# Patient Record
Sex: Female | Born: 1975 | Race: White | Hispanic: No | Marital: Married | Smoking: Never smoker
Health system: Southern US, Community
[De-identification: ages and names within clinical notes are randomized; demographics above are authoritative.]

## PROBLEM LIST (undated history)

## (undated) DIAGNOSIS — E079 Disorder of thyroid, unspecified: Secondary | ICD-10-CM

## (undated) DIAGNOSIS — G90A Postural orthostatic tachycardia syndrome (POTS): Secondary | ICD-10-CM

## (undated) DIAGNOSIS — R5382 Chronic fatigue, unspecified: Secondary | ICD-10-CM

## (undated) DIAGNOSIS — I639 Cerebral infarction, unspecified: Secondary | ICD-10-CM

## (undated) DIAGNOSIS — T7840XA Allergy, unspecified, initial encounter: Secondary | ICD-10-CM

## (undated) DIAGNOSIS — R112 Nausea with vomiting, unspecified: Secondary | ICD-10-CM

## (undated) DIAGNOSIS — D219 Benign neoplasm of connective and other soft tissue, unspecified: Secondary | ICD-10-CM

## (undated) DIAGNOSIS — M549 Dorsalgia, unspecified: Secondary | ICD-10-CM

## (undated) DIAGNOSIS — Z862 Personal history of diseases of the blood and blood-forming organs and certain disorders involving the immune mechanism: Secondary | ICD-10-CM

## (undated) DIAGNOSIS — I951 Orthostatic hypotension: Secondary | ICD-10-CM

## (undated) DIAGNOSIS — R51 Headache: Secondary | ICD-10-CM

## (undated) DIAGNOSIS — J45909 Unspecified asthma, uncomplicated: Secondary | ICD-10-CM

## (undated) DIAGNOSIS — G47419 Narcolepsy without cataplexy: Secondary | ICD-10-CM

## (undated) DIAGNOSIS — Z9889 Other specified postprocedural states: Secondary | ICD-10-CM

## (undated) DIAGNOSIS — I498 Other specified cardiac arrhythmias: Secondary | ICD-10-CM

## (undated) DIAGNOSIS — R000 Tachycardia, unspecified: Secondary | ICD-10-CM

## (undated) HISTORY — DX: Cerebral infarction, unspecified: I63.9

## (undated) HISTORY — PX: TONSILLECTOMY AND ADENOIDECTOMY: SHX28

## (undated) HISTORY — PX: OTHER SURGICAL HISTORY: SHX169

## (undated) HISTORY — DX: Allergy, unspecified, initial encounter: T78.40XA

## (undated) HISTORY — PX: ABDOMINAL HYSTERECTOMY: SHX81

## (undated) HISTORY — PX: TONSILLECTOMY: SUR1361

---
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
## 1998-09-21 HISTORY — PX: NASAL SINUS SURGERY: SHX719

## 2006-04-28 ENCOUNTER — Emergency Department (HOSPITAL_COMMUNITY): Admission: EM | Admit: 2006-04-28 | Discharge: 2006-04-28 | Payer: Self-pay | Admitting: Emergency Medicine

## 2006-06-12 ENCOUNTER — Encounter: Admission: RE | Admit: 2006-06-12 | Discharge: 2006-06-12 | Payer: Self-pay | Admitting: Neurology

## 2006-07-01 ENCOUNTER — Emergency Department (HOSPITAL_COMMUNITY): Admission: EM | Admit: 2006-07-01 | Discharge: 2006-07-01 | Payer: Self-pay | Admitting: Emergency Medicine

## 2006-07-07 ENCOUNTER — Ambulatory Visit: Payer: Self-pay | Admitting: Internal Medicine

## 2006-09-28 ENCOUNTER — Ambulatory Visit: Payer: Self-pay | Admitting: Internal Medicine

## 2006-10-18 ENCOUNTER — Ambulatory Visit: Payer: Self-pay | Admitting: Internal Medicine

## 2006-10-18 LAB — CONVERTED CEMR LAB
BUN: 19 mg/dL (ref 6–23)
Calcium: 8.7 mg/dL (ref 8.4–10.5)
Chloride: 111 meq/L (ref 96–112)
GFR calc Af Amer: 94 mL/min
GFR calc non Af Amer: 78 mL/min

## 2006-12-31 ENCOUNTER — Ambulatory Visit: Payer: Self-pay | Admitting: Internal Medicine

## 2007-01-17 ENCOUNTER — Ambulatory Visit: Payer: Self-pay | Admitting: Internal Medicine

## 2007-01-17 LAB — CONVERTED CEMR LAB
Calcium: 8.8 mg/dL (ref 8.4–10.5)
Chloride: 114 meq/L — ABNORMAL HIGH (ref 96–112)
Creatinine, Ser: 0.8 mg/dL (ref 0.4–1.2)
GFR calc non Af Amer: 89 mL/min

## 2007-07-04 ENCOUNTER — Ambulatory Visit: Payer: Self-pay | Admitting: Internal Medicine

## 2007-07-04 LAB — CONVERTED CEMR LAB
Basophils Relative: 0.4 % (ref 0.0–1.0)
CO2: 28 meq/L (ref 19–32)
Calcium: 8.6 mg/dL (ref 8.4–10.5)
Eosinophils Relative: 0.6 % (ref 0.0–5.0)
GFR calc Af Amer: 150 mL/min
Glucose, Bld: 94 mg/dL (ref 70–99)
HCT: 39.5 % (ref 36.0–46.0)
Hemoglobin: 13.7 g/dL (ref 12.0–15.0)
Lymphocytes Relative: 21.7 % (ref 12.0–46.0)
MCV: 93.8 fL (ref 78.0–100.0)
Monocytes Absolute: 0.5 10*3/uL (ref 0.2–0.7)
Neutro Abs: 5.2 10*3/uL (ref 1.4–7.7)
Neutrophils Relative %: 71 % (ref 43.0–77.0)
Sodium: 145 meq/L (ref 135–145)
WBC: 7.3 10*3/uL (ref 4.5–10.5)

## 2007-12-21 ENCOUNTER — Ambulatory Visit: Payer: Self-pay | Admitting: Internal Medicine

## 2007-12-22 ENCOUNTER — Ambulatory Visit: Payer: Self-pay | Admitting: Internal Medicine

## 2007-12-22 LAB — CONVERTED CEMR LAB
BUN: 12 mg/dL (ref 6–23)
Calcium: 8.7 mg/dL (ref 8.4–10.5)
Chloride: 111 meq/L (ref 96–112)
Creatinine, Ser: 0.7 mg/dL (ref 0.4–1.2)
GFR calc non Af Amer: 103 mL/min

## 2008-04-01 ENCOUNTER — Ambulatory Visit: Payer: Self-pay | Admitting: Cardiology

## 2008-04-01 ENCOUNTER — Inpatient Hospital Stay (HOSPITAL_COMMUNITY): Admission: EM | Admit: 2008-04-01 | Discharge: 2008-04-03 | Payer: Self-pay | Admitting: Emergency Medicine

## 2008-04-18 ENCOUNTER — Ambulatory Visit: Payer: Self-pay | Admitting: Internal Medicine

## 2008-04-18 LAB — CONVERTED CEMR LAB
CO2: 26 meq/L (ref 19–32)
Calcium: 9.1 mg/dL (ref 8.4–10.5)
Creatinine, Ser: 0.8 mg/dL (ref 0.4–1.2)
Glucose, Bld: 88 mg/dL (ref 70–99)

## 2008-06-25 ENCOUNTER — Ambulatory Visit: Payer: Self-pay | Admitting: Internal Medicine

## 2008-06-25 LAB — CONVERTED CEMR LAB
Basophils Absolute: 0 10*3/uL (ref 0.0–0.1)
Eosinophils Absolute: 0.1 10*3/uL (ref 0.0–0.7)
MCHC: 35.1 g/dL (ref 30.0–36.0)
MCV: 92.9 fL (ref 78.0–100.0)
Neutrophils Relative %: 55.1 % (ref 43.0–77.0)
Platelets: 177 10*3/uL (ref 150–400)
WBC: 5.8 10*3/uL (ref 4.5–10.5)

## 2009-01-07 DIAGNOSIS — Q078 Other specified congenital malformations of nervous system: Secondary | ICD-10-CM | POA: Insufficient documentation

## 2009-01-07 DIAGNOSIS — Z91013 Allergy to seafood: Secondary | ICD-10-CM | POA: Insufficient documentation

## 2009-01-07 DIAGNOSIS — I10 Essential (primary) hypertension: Secondary | ICD-10-CM | POA: Insufficient documentation

## 2009-01-07 DIAGNOSIS — G479 Sleep disorder, unspecified: Secondary | ICD-10-CM | POA: Insufficient documentation

## 2009-01-17 ENCOUNTER — Ambulatory Visit: Payer: Self-pay | Admitting: Internal Medicine

## 2009-01-17 DIAGNOSIS — R238 Other skin changes: Secondary | ICD-10-CM | POA: Insufficient documentation

## 2009-02-19 ENCOUNTER — Telehealth: Payer: Self-pay | Admitting: Internal Medicine

## 2009-05-23 ENCOUNTER — Encounter: Payer: Self-pay | Admitting: Internal Medicine

## 2009-07-15 ENCOUNTER — Ambulatory Visit: Payer: Self-pay | Admitting: Internal Medicine

## 2009-09-17 ENCOUNTER — Other Ambulatory Visit: Admission: RE | Admit: 2009-09-17 | Discharge: 2009-09-17 | Payer: Self-pay | Admitting: Family Medicine

## 2010-03-06 ENCOUNTER — Telehealth: Payer: Self-pay | Admitting: Internal Medicine

## 2010-03-07 ENCOUNTER — Emergency Department (HOSPITAL_COMMUNITY): Admission: EM | Admit: 2010-03-07 | Discharge: 2010-03-07 | Payer: Self-pay | Admitting: Emergency Medicine

## 2010-03-10 ENCOUNTER — Ambulatory Visit: Payer: Self-pay | Admitting: Internal Medicine

## 2010-04-02 ENCOUNTER — Telehealth: Payer: Self-pay | Admitting: Internal Medicine

## 2010-04-08 ENCOUNTER — Encounter: Payer: Self-pay | Admitting: Internal Medicine

## 2010-04-10 ENCOUNTER — Telehealth: Payer: Self-pay | Admitting: Internal Medicine

## 2010-04-14 ENCOUNTER — Telehealth: Payer: Self-pay | Admitting: Internal Medicine

## 2010-04-14 ENCOUNTER — Encounter: Payer: Self-pay | Admitting: Internal Medicine

## 2010-06-23 ENCOUNTER — Ambulatory Visit: Payer: Self-pay | Admitting: Internal Medicine

## 2010-09-26 ENCOUNTER — Other Ambulatory Visit
Admission: RE | Admit: 2010-09-26 | Discharge: 2010-09-26 | Payer: Self-pay | Source: Home / Self Care | Admitting: Family Medicine

## 2010-10-21 NOTE — Progress Notes (Signed)
Summary: Need note for work  Phone Note Call from Patient Call back at [employer] [phone]   Caller: Patient Summary of Call: Pt need note for work stating her heart problem.Working giving pt hard time    [employer] with Ms Pascal Lux.      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   Now need another note stating how we define fluctuating hours and that standing in front of over adversely affects her.  Says at this point they are threatening her job and what we put in note may directly affect if they keep her or not.   She will be at work on the 15th but can answer her phone after  3:30 pm  if you need to speak with her.   Initial call taken by: Judie Grieve,  April 02, 2010 1:12 PM  Follow-up for Phone Call        Spoke with Ms Pascal Lux.   She says her employer doesnt understand the note with gave her.  Says they dont understand what not having fluctuating hours mean.  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  Says they are now threatening her job , if our not e doesnt state what they are looking for, but wouldnt provide that to her.  Shes off today but has to  work the 15th..  If you call then call after  3:30hrs   Pt returning call about note for work call back [phone] Judie Grieve  April 07, 2010 3:02 PM Follow-up by: Letta Moynahan, EMT,  April 03, 2010 11:39 AM

## 2010-10-21 NOTE — Assessment & Plan Note (Signed)
Summary: rov.amber   Visit Type:  ROV Primary Provider:  Mila Palmer, MD  CC:  shortness of breath, CP-heart races, and dizziness.  History of Present Illness: [name] is seen in followup for her dysautonomia and POTS.  Sis contagious trouble with her work situation. She had recurrent syncope or working in front of the other limbs. That has been changed. He has now been trying to change her work schedule. The patient is quite concerned about the impact that that'll have on her dizziness.    Problems Prior to Update: 1)  Ecchymoses  (ICD-782.9) 2)  Shellfish Allergy  (ICD-V15.04) 3)  Unspecified Sleep Disturbance  (ICD-780.50) 4)  Essential Hypertension, Benign  (ICD-401.1) 5)  Dysautonomia  (ICD-742.8)  Current Medications (verified): 1)  Vitamin D 1000 Unit  Tabs (Cholecalciferol) .... Once Daily  Allergies (verified): 1)  ! Erythromycin 2)  ! Sulfa 3)  ! * Shellfish 4)  * Shellfish  Past History:  Past Medical History: Last updated: 01/07/2009 1. 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  2. Hypertension - iatrogenic.  3. Hypokalemia. 4. Sleep disturbance.  Vital Signs:  Patient profile:   35 year old female Height:      63 inches Weight:      213.25 pounds BMI:     37.91 Pulse rate:   70 / minute BP sitting:   112 / 72  (right arm) Cuff size:   regular  Vitals Entered By: Caralee Ates CMA (June 23, 2010 2:25 PM)  Physical Exam  General:  The patient was alert and oriented in no acute distress. HEENT Normal.  Neck veins were flat, carotids were brisk.  Lungs were clear.  Heart sounds were regular without murmurs or gallops.  Abdomen was soft with active bowel sounds. There is no clubbing cyanosis or edema. Skin Warm and dry    EKG  Procedure date:  06/23/2010  Findings:      sinus rhythm at 70 neck sandals 0.16/09/0.39 Axis is 80 otherwise normal  Impression & Recommendations:  Problem # 1:  DYSAUTONOMIA (ICD-742.8) the  patient has done a little bit better since having been removed from the hot oven exposure. She is quite concerned about the impact of his scheduled change in her symptoms. I don't know the specific reason for which I should intervene as relates to her schedule.she'll continue her exercises and her salt and water intake  Patient Instructions: 1)  Your physician recommends that you continue on your current medications as directed. Please refer to the Current Medication list given to you today. 2)  Your physician wants you to follow-up in:  6months. You will receive a reminder letter in the mail two months in advance. If you don't receive a letter, please call our office to schedule the follow-up appointment.

## 2010-10-21 NOTE — Letter (Signed)
Summary: [employer] Note   [employer] Note   Imported By: Roderic Ovens 04/21/2010 10:55:06  _____________________________________________________________________  External Attachment:    Type:   Image     Comment:   External Document

## 2010-10-21 NOTE — Assessment & Plan Note (Signed)
Summary: rapid beat and fluid retention   Visit Type:  Follow-up Primary Provider:  Mila Hicks, Lori Hicks  CC:  swelling in arms and hands.  History of Present Illness: [name] is seen in followup for her dysautonomia and POTS.  She saw Dr. Paulino Hicks last week because of pain in her arms and her legs as well as the swelling. She was noted to have a fast heart rate. However an ECG was not done. one of these visits her pressure was recorded apparently at 150/110   She then ended up iin yhe emergency room because of significant swelling and discomfort over her upper and lower extremities. She was treated with intravenous fluids with significant improvement in her weakness dizziness and as well as in her edema.  I have reviewed emergency room records. He describes significant nausea and vomiting. Laboratories were all normal.  Her mother also makes the observation that her symptoms are significantly worse around the time of her period. Efforts to control her menses with birth control pills have accompanied by headaches  She continues to complain of her uper and lower extremeities feeling constricted"like BP cuffs" and she is using NSAIDS for pain relief   For the most part her dizziness is stable.  Allergies: 1)  ! Erythromycin 2)  ! Sulfa 3)  ! * Shellfish 4)  * Shellfish  Vital Signs:  Patient profile:   35 year old female Height:      63 inches Weight:      212 pounds BMI:     37.69 Pulse (ortho):   88 / minute BP standing:   110 / 75  Vitals Entered By: Lori Hicks CMA (March 10, 2010 3:28 PM)  Serial Vital Signs/Assessments:  Time      Position  BP       Pulse  Resp  Temp     By           Lying LA  117/77   84                    Lori Hicks CMA           Sitting   102/71   78                    Lori Hicks CMA           Standing  110/75   88                    Lori Hicks CMA           Standing  111/85   95                    Lori Hicks CMA           Standing  108/88   84                     Lori Hicks CMA  Comments: Numbness in limbs dizziness By: Lori Hicks CMA    Physical Exam  General:  The patient was alert and oriented in no acute distress. HEENT Normal.  Neck veins were flat, carotids were brisk.  Lungs were clear.  Heart sounds were regular without murmurs or gallops.  Abdomen was soft with active bowel sounds. There is no clubbing cyanosis or edema. Skin Warm and dry    Impression & Recommendations:  Problem # 1:  ESSENTIAL HYPERTENSION, BENIGN (ICD-401.1)  her blood pressure is well controlled at this point. Because of her history of recent flares of blood pressure, however, I have suggested that she try to wean herself off her Gatorade as much as she can. The floor below which she should not pass would be when her symptoms of POTS worsen.  Problem # 2:  DYSAUTONOMIA (ICD-742.8) the patient is dysautonomia remains a problem. I suspect that part of what happened at the emergency room was intravascular volume depletion but responded to intravenous saline. When I don't understand his where the extracellular fluid came from.  Y. all got better with intravenous fluid either is beyond me.  It is interesting that her symptoms seem to have worsened following initiation of exercise and that in general her symptoms worse around the time of her period I suggested that she meet with her gynecologist and discuss the role of oral contraceptive therapy for long-term suppression of menses. Subcutaneous birth control may have a followup of headaches, but that is a  conjecture and not known.  in addition the role of vertical exercise can be challenging. I suggested she she try undertake horizontal exercise as much as possible at least initially .

## 2010-10-21 NOTE — Progress Notes (Signed)
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

## 2010-10-21 NOTE — Letter (Signed)
Summary: Work Writer, Main Office  [address]   [place], [place] [address]   Phone: [phone]  Fax: [phone]     March 10, 2010    Lori Hicks   The above named patient was seen in the emergency room 03-07-10 and was seen in the office today, 03-10-10.  Please take this into consideration when considering time away from work.    Sincerely yours,  Architectural technologist

## 2010-10-21 NOTE — Progress Notes (Signed)
Summary: pt needs sooner appt/  Phone Note Call from Patient Call back at Home Phone [phone]   Caller: Patient Reason for Call: Talk to Nurse, Talk to Doctor Summary of Call: pt pcp said she needs a sooner appt because her heart is racing and she is retaining fluid Initial call taken by: Omer Jack,  March 06, 2010 4:33 PM  Follow-up for Phone Call        Has been drinking alot of Gatorade but doesn't feel like it's helping.  appt given for Monday 03/10/2010 at 3:30 pm  pt was advised if s/s worsen to callback or go to ED.  She states understanding. Follow-up by: Charolotte Capuchin, RN,  March 06, 2010 4:57 PM

## 2010-10-21 NOTE — Progress Notes (Signed)
Summary: Request call  Phone Note Call from Patient Call back at Home Phone [phone]   Caller: Patient Summary of Call: pt requeet call Initial call taken by: Judie Grieve,  April 14, 2010 8:49 AM  Follow-up for Phone Call        PER PT MAY INFORM  EMPLOYER  THE DEFINITION OF POTS ONLY. Follow-up by: Scherrie Bateman, LPN,  April 14, 2010 9:17 AM  Additional Follow-up for Phone Call Additional follow up Details #1::        Spoke with employeer per patient request.  Restrictions verified.  POTS defined and explained. Gypsy Balsam RN BSN  April 15, 2010 5:10 PM

## 2010-10-21 NOTE — Letter (Signed)
Summary: Generic Letter  Architectural technologist, Main Office  [address]   [place], [place] [address]   Phone: [phone]  Fax: [phone]        April 08, 2010 MRN: [id number]    Lori Hicks [address], [place]  [address]    To Whom It May Concern:  Lori Hicks is a patient under my care who has dysautonomia and postural orthostatic tachycardia syndrome.  There are many conditions that can aggravate her symptoms.  In order to give her the best chance at success with treatment plan, she should work stable work hours.  This is defined as the same hours each day preferrably during the day.  Patient should also avoid extreme temperatures.  Hot temperatures will make her symptoms of pre-syncope and palpitations worse.  Please call with questions about how to optimize patient's working conditions.          Sincerely,  Gypsy Balsam RN BSN Duke Salvia, MD, [facility]  This letter has been electronically signed by your physician.

## 2010-12-07 LAB — URINALYSIS, ROUTINE W REFLEX MICROSCOPIC
Ketones, ur: NEGATIVE mg/dL
Nitrite: NEGATIVE
Specific Gravity, Urine: 1.012 (ref 1.005–1.030)
Urobilinogen, UA: 0.2 mg/dL (ref 0.0–1.0)
pH: 6.5 (ref 5.0–8.0)

## 2010-12-07 LAB — DIFFERENTIAL
Lymphocytes Relative: 31 % (ref 12–46)
Monocytes Absolute: 0.6 10*3/uL (ref 0.1–1.0)
Monocytes Relative: 10 % (ref 3–12)
Neutro Abs: 3.8 10*3/uL (ref 1.7–7.7)

## 2010-12-07 LAB — POCT I-STAT, CHEM 8
BUN: 21 mg/dL (ref 6–23)
Calcium, Ion: 1.13 mmol/L (ref 1.12–1.32)
Chloride: 108 mEq/L (ref 96–112)
Glucose, Bld: 87 mg/dL (ref 70–99)

## 2010-12-07 LAB — POCT CARDIAC MARKERS: Troponin i, poc: <0.05 ng/mL (ref 0.00–0.09)

## 2010-12-07 LAB — CBC
Hemoglobin: 14.3 g/dL (ref 12.0–15.0)
RBC: 4.52 MIL/uL (ref 3.87–5.11)

## 2010-12-07 LAB — URINE CULTURE: Colony Count: NO GROWTH

## 2011-02-03 NOTE — Letter (Signed)
April 18, 2008    Emeterio Reeve, MD  [address]  [place] [address]   RE:  DAMEKA, YOUNKER  MRN:  [id number]  /  DOB:  [birth year]   Dear Jasmine December:   Domingo Cocking came in today following a recent hospitalization for  dehydration and significant orthostatic intolerance.  She is about as  well as she was when she went home, which is not as well as she had  been.  She has had one intercurrent episode of syncope.  She is having  problems with persistent orthostatic intolerance with prolonged  standing.  She also describes the sensation of a blood pressure cuff  around her arms and legs; she also uses the term tingling to apply to  this.   There are no new medications.   She brings in a letter from her insurance company regarding Topamax and  possibly uses it generically.  She relates that Dr. Dennie Fetters had started it  on her for headaches prior to the diagnosis of dysautonomia.   Her other medications include Florinef 200 mg twice daily, Claritin, and  Singulair.   PHYSICAL EXAMINATION:  Her blood pressure today was 123/83 with a pulse  of 79, weight was 182, which is down 9 pounds.  Her lungs were clear.  The heart sounds were regular.  Extremities were without edema.  Skin  was warm and dry.   IMPRESSION:  1. Orthostatic intolerance in the context of dysautonomia and prior      diagnosis of postural orthostatic tachycardia syndrome.  2. Hypokalemia.  3. Sleep disturbance.   Jasmine December, I have asked her to follow up with you about the sleep issue.  We will recheck her potassium today stated it is low and I want to make  sure that it is not going to be aggravated ongoingly by her use of the  Florinef.  I have asked her to discuss with you the specifics of how she  can terminate her Topamax.  I am not familiar with it, and I have to  know whether it needs to weaned or it can be abruptly terminated.  Two  weeks after we stop it, we plan to begin her on ProAmatine at 2.5  mg 3  times a day dosed at 7, 11, and 3.  I wanted to make sure that any  untoward effects from the ProAmatine are not confused with the  discontinuation of the Topamax.   We will see her again in 2 months' time.  Let me know if there is  anything I can do in the interim.    Sincerely,      Duke Salvia, MD, [facility]  Electronically Signed    SCK/MedQ  DD: 04/18/2008  DT: 04/19/2008  Job #: [id number]

## 2011-02-03 NOTE — Assessment & Plan Note (Signed)
[facility]                         ELECTROPHYSIOLOGY OFFICE NOTE   NAME:Lori Hicks, Lori Hicks                            MRN:          [id number]  DATE:06/25/2008                            DOB:          03-06-1976    [name] is seen in follow up for her dysautonomia manifested by  orthostatic intolerance.  She has had no problems with dizziness.  She  is however having problems with headaches and knows that her blood  pressure was up and also with bruising in her lower extremities.  She  has a remote history ITP.   Her medications currently include midodrine 2.5 t.i.d., [place] 200  a.m. and p.m., and Claritin.   On examination, her blood pressure is 140/101, her pulse of 69, her  weight was 190 which is an 8-pound gain since July, but probably in  error because it is the same weight as April.  Her lungs were clear.  Heart sounds were regular.  The extremities were without edema.  The  legs had none raised ecchymoses.   IMPRESSION:  1. 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  2. Hypertension - iatrogenic.  3. 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   As related to her hypertension, we will plan to cut her Florinef in half  down to 100 mcg twice a day.  She will call us in 1 to 2 weeks and will  let us know how her blood pressure is doing; she is recording it at  home.  As relates to her bruising, we will check a CBC given her remote  medical history; in the event that it persists we will need to have her  follow up with her primary care physician.      Duke Salvia, MD, [facility]  Electronically Signed    SCK/MedQ  DD: 06/25/2008  DT: 06/26/2008  Job #: [id number]

## 2011-02-03 NOTE — Assessment & Plan Note (Signed)
[facility]                         ELECTROPHYSIOLOGY OFFICE NOTE   NAME:Lori Hicks, Lori Hicks                            MRN:          [id number]  DATE:07/04/2007                            DOB:          1976-05-17    [name] comes in.  She has had significant improvement in her  dysautonomic symptoms.  This relates temporally to 3 changes.  The first  is that she has switched jobs which was very stressful.  Second is that  we increased her Florinef from100 to 200 mg daily, and three is her new  work environment is quite cold.   She notes that her symptoms are significantly worse around the time of  her periods.  These are described as modest.   MEDICATIONS:  1. Florinef 200 mg twice daily.  2. Singulair, Claritin, and Topamax.   EXAMINATION:  Her blood pressure today was 102/82 with a pulse of 62.  There was a modest increase in heart rate from 58 to 75 upon standing  without change in blood pressure.  Her vital signs then remained  relatively stable over 5 minutes of prolonged standing.  LUNGS:  Clear.  HEART SOUNDS:  Regular.  EXTREMITIES:  Without edema.   IMPRESSION:  1. Dysautonomic syncope.  2. Aberration in symptoms around the time of her period.   We will plan to check a BMET today to look at her potassium and sodium  given her Florinef therapy.  We will plan to check a CBC given her  worsening symptoms around the time of her period.   I will forward these results to Dr. Barbee Shropshire.   We will see her again in 6 months' time and if she continues to do well,  we will try and down-titrate her Florinef at that point.     Duke Salvia, MD, [facility]  Electronically Signed    SCK/MedQ  DD: 07/04/2007  DT: 07/04/2007  Job #: [id number]   cc:   Olene Craven, M.D.

## 2011-02-03 NOTE — Assessment & Plan Note (Signed)
[facility]                         ELECTROPHYSIOLOGY OFFICE NOTE   Lori, [name]                            MRN:          [id number]  DATE:12/21/2007                            DOB:          [birth year]    Lori Hicks is seen in followup for POTS.  She has done fairly well.  She  has abrogated symptoms now on her Florinef 200 mg twice daily.  She  knows when to sit down and lie down.  She still has some dizziness.   PHYSICAL EXAMINATION:  Her blood pressure is 90/60 with a pulse of 68.  LUNGS:  Clear.  CARDIAC:  Heart sounds were regular.  EXTREMITIES:  Without edema.   IMPRESSION:  1. POTS (postural orthostatic tachycardia syndrome).  2. Mild hypotension.   [name] is doing pretty well.  We will keep her on her current  medications.  I will plan to see her again in six months' time.     Duke Salvia, MD, [facility]  Electronically Signed    SCK/MedQ  DD: 12/21/2007  DT: 12/21/2007  Job #: [id number]

## 2011-02-03 NOTE — H&P (Signed)
NAME:  Lori Hicks, Lori Hicks NO.:  [id number]   MEDICAL RECORD NO.:  [id number]          PATIENT TYPE:  EMS   LOCATION:  [facility]                         FACILITY:  [facility]   PHYSICIAN:  Luis Abed, MD, FACCDATE OF BIRTH:  01-10-1976   DATE OF ADMISSION:  04/01/2008  DATE OF DISCHARGE:                              HISTORY & PHYSICAL   PRIMARY CARDIOLOGIST:  Duke Salvia, MD, [facility]   PRIMARY CARE PHYSICIAN:  Dr. Laurine Blazer.   SUMMARY OF HISTORY:  Lori Hicks is a well known patient to Dr. Graciela Husbands with  a history of POTS.  She describes at least a 2-day of history of  not  feeling right, generalized fatigue, and dizziness particularly with  standing.  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  She noticed a increased  heart rate and she sat down to try to rest but her coworkers felt that  she was very very pale.  Her supervisor drove her home where she laid  down.  She increased her fluid and sodium intake.  After calling her  father, her father states that she look gray and ashton.  She did check  her blood pressure but she cannot recall what her blood pressure or  heart rate was.  She stated that she proceeded to experience nausea and  vomited Gatorade that she had been drinking for the rest of the  afternoon.  Evening, she stated that she ate a high-sodium food and she  was able to sleep without problems.  However waking today, she stated  she still did not feel well, so she called today for evaluation via her  phone call, I explained to her that without evaluating her I could not  elaborate if this is particularly related to her POTS syndrome or if she  could receive IV fluids and then be discharged home from the emergency  room.  She agreed to come to the emergency room for evaluation thus her  presentation.   ALLERGIES:  Allergies include sulfa, erythromycin, and shellfish.   MEDICATIONS:  1. Prior to  admission, include Topamax 100 mg daily.  2. Claritin 10 mg daily.  3. Singulair 10 mg daily.  4. Florinef 0.1 mg 2 tablets twice a day.   She relays a history of POTS syndrome and is seen regularly by Dr.  Graciela Husbands.  She has had syncope  in the past.  She has seasonal allergies.   SURGICAL HISTORY:  Surgical history is notable for T&A, multiple ear  surgeries during childhood, and sinus surgery.   SOCIAL HISTORY:  She resides alone in [place].  She is a Education officer, environmental with most foods.  She denies any tobacco, alcohol, or drug  usage.   FAMILY HISTORY:  Notable for her parents are both living.  Her mother is  46 with Sjogren syndrome, fibromyalgia, and multiple GI issues.  Father  is 50 with prediabetes and hypertension.  Sister is alive and well.  Brother has multiple GI issues.  REVIEW OF SYSTEMS:  In addition to the above, is notable for sinus,  congestion, and palpitations, last menstrual period 03/30/2008, in  addition to the above.   PHYSICAL EXAMINATION:  GENERAL:  Well-nourished, well developed pleasant  white female who appears pale and weak. Father is present.  VITAL SIGNS:  Temperature is 99.1, blood pressure 123/80, pulse 107,  respirations 21, and 98% sat on room air.  HEENT:  Unremarkable.  NECK:  Supple without thyromegaly, adenopathy, JVD, or carotid bruits.  CHEST:  Symmetrical excursion.  LUNGS:  Clear to auscultation.  HEART:  PMI is nondisplaced. regular rapid rhythm without murmurs, rubs,  clicks or gallops. All pulses appear to be intact without abdominal or  femoral bruits.  EXTREMITIES:  Negative  cyanosis, sinus, or edema.  NEUROLOGICAL:  Unremarkable.  MUSCULOSKELETAL:  Unremarkable.   EKG and labs are pending at the time at this dictation.   IMPRESSION:  Near syncope yesterday with known history of POTS.  Currently, her blood pressure appears to be stable, however she is  slightly tachycardic.   Dr. Graciela Husbands reviewed the patient's past medical  history, spoke when  examining the patient agrees with the above.  We will admit her  overnight for observation and continue her on medications as well as  administer IV fluids.  We will check orthostatic blood pressure and  pulses on admission and in the morning.  We will have Dr. Graciela Husbands review  for further recommendations.  If the patient is stable, then per Dr.  Odessa Fleming review anticipate of discharge is on the morning of April 04, 2008.      Joellyn Rued, PA-C      Luis Abed, MD, [facility]  Electronically Signed    EW/MEDQ  D:  04/01/2008  T:  04/02/2008  Job:  [id number]   cc:   Duke Salvia, MD, [facility]

## 2011-02-03 NOTE — Discharge Summary (Signed)
Lori Hicks, Lori Hicks                   ACCOUNT NO.:  [id number]   MEDICAL RECORD NO.:  [id number]          PATIENT TYPE:  INP   LOCATION:  4730                         FACILITY:  [facility]   PHYSICIAN:  Duke Salvia, MD, FACCDATE OF BIRTH:  [birth year]   DATE OF ADMISSION:  04/01/2008  DATE OF DISCHARGE:  04/03/2008                               DISCHARGE SUMMARY   This patient has allergies to SULFA, ERYTHROMYCIN, and SHELLFISH.   TIME FOR THIS DICTATION:  Greater than 30 minutes with examination.   FINAL DIAGNOSES:  1. Admit with the presyncope/fatigue.  2. Hypokalemia with a potassium on admission of 3.2.  3. Orthostasis on admission with improvement after:      a.     Supplementation with oral potassium.      b.     The patient drinking Gatorade one full bottle daily.      c.     IV normal saline aggressively replenished.   SECONDARY DIAGNOSES:  1. History of POTS.  2. Migraines.  3. Seasonal allergies.   No procedures this admission.   BRIEF HISTORY:  [name] is a 35 year old female.  She was admitted on  April 01, 2008, after 48 hours of fatigue and dizziness, particularly  with standing.  Her blood pressure on admission was in the 90s systolic,  heart rate is in the 80s.  The patient complaining of intermittent  nausea.   At work on March 31, 2008, the patient felt as if she were going to pass  out.  She was pale.  She went home and rested.  She knows that she has  POTS and has been aggressively replacing her fluids and electrolytes.  She continued to do this, but had nausea and vomiting and despite fluid  electrolyte intake felt poorly on April 01, 2008, and came to the  emergency room.   HOSPITAL COURSE:  The patient was admitted with orthostasis and  presyncope from the emergency room on April 01, 2008.  She was  aggressively treated with IV diuresis.  She was continued on her home  medications of fludrocortisone 0.2 mg twice daily.  Her potassium was  aggressively  replaced orally and she continued drinking Gatorade which  she does on a daily basis.  Once again, she had IV fluid replacement.   After 48 hours of treatment with IV fluids, oral potassium, and  Gatorade, the patient's orthostatic blood pressure measurements evened  out from lying to standing.  After 5 minutes, the blood pressure were  equivalent.  Her heart rate increased from lying 57 to 74 after standing  for 5 minutes.  This was acceptable with Dr. Graciela Husbands.  The patient was  discharged on April 03, 2008.  Of note on admission, she was unable to  stand greater than 3 minutes without feeling symptoms of presyncope.  At  the time of discharge, she is doing much better.  Potassium on the day  of discharge was 3.7 after 80 mEq of potassium the day before.   The patient was discharged on home medications including:  1.  Fludrocortisone 0.2 mg b.i.d.  2. Singular 10 mg daily.  3. Claritin 10 mg daily.  4. Topamax 100 mg daily.   Dr. Odessa Fleming office will call with an appointment for October 2009.   LABORATORY STUDIES THIS ADMISSION:  Complete blood count:  Hemoglobin  12.9, hematocrit 37, white cells 5.6, and platelets of 148,000.  Serum  electrolytes on the day of discharge:  Sodium 144, potassium 3.7,  chloride 114, carbonate 26, BUN is 5, creatinine is 0.69, and glucose  104.  Alkaline phosphatase is 35, SGOT 17, and SGPT 21.  Urinalysis was  negative.      Lori Hicks, [place]      Duke Salvia, MD, [facility]  Electronically Signed    GM/MEDQ  D:  04/03/2008  T:  04/03/2008  Job:  [id number]
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
## 2011-02-06 NOTE — Assessment & Plan Note (Signed)
[facility]                         ELECTROPHYSIOLOGY OFFICE NOTE   CHANTERIA, HAGGARD                            MRN:          [id number]  DATE:09/28/2006                            DOB:          [birth year]    Lori Hicks is seen.  She continues to have spells of lightheadedness.  She has been working at [employer] .  Her symptoms are somewhat  better.  She is drinking a gallon of Gatorade a day or so. Her urine  color remains yellow most of the time, however.  She is adding salt to  her food.  Heat and prolonged standing still are problematic.  She has  had more problems with migraine headaches.   MEDICATIONS:  1. Topamax now at 100 mg a day.  2. Citalopram 20 mg a day.  3. Zanaflex 22 mg t.i.d.   PHYSICAL EXAMINATION:  VITAL SIGNS:  Blood pressure 124/88, pulse 76.   IMPRESSION:  1. Potts and dysautonomia.  2. Migraine headache.  3. Epstein-Barre.  4. Idiopathic thrombocytopenic purpura.   We will continue to encourage salt and water intake.  Again, it is  suggested that she understand that her target urine color is clear.   I have given her a prescription today for Florinef 100 mcg twice a day.  We are going to check her BMET in two weeks.  I will see her again in  three months.     Duke Salvia, MD, [facility]  Electronically Signed    SCK/MedQ  DD: 09/28/2006  DT: 09/29/2006  Job #: [id number]   cc:   [facility]

## 2011-02-06 NOTE — Letter (Signed)
July 07, 2006    Santina Evans A. Orlin Hilding, M.D.  [address]  [address]  [place], [place] [address]   RE:  Lori Hicks, Lori Hicks  MRN:  [id number]  /  DOB:  June 11, 1976   Dear Jae Dire:   It was a pleasure to see Lori Hicks today.  She comes in with her mother and  her father.   She was referred for consideration of tilt table testing.  She has had 2  episodes of syncope; one occurred in August, one occurred the other day.  They were both quite similar.  They both occurred following a trip to the  bathroom for urination; one was at a furniture store, one was while at work.  They were, in her mind, abrupt in onset, although there is a clear  recognizable prodrome.  She went out.  She was described as pale.  There was  some significant nausea and impending emesis, which actually prompted her to  get up and get to the bathroom.  After she came back, she again had  significant orthostatic intolerance and residual fatigue.  She is described  as noted as quite pale.  She had diffuse aches as well.  On both occasions,  EMS was called and blood pressures were recorded in the 60 to 80 range.  Heart rates are not available.   She has a history of shower intolerance.  She has some history of  orthostatic intolerance.  In her mind, these syncopal episodes date back to  August.  There was no antecedent viral illness.  The shower intolerance,  however, antedates that significantly.   She also has a history of palpitations that can occur lying or standing.  She does not have orthostatic tachy palpitations.   Her diet is salt deplete.  It is also fluid deplete.   PAST MEDICAL HISTORY:  Significant for:  1. ITP.  2. 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  3. Chronic ear problems.  4. A diagnosis of chronic fatigue and issues that she relates to chronic      illness and its impact on her life.   Her review of systems across 12 systems was notable for some sleeping  difficulties, numbness and tingling in her arms  and legs as noted by you in  your note, visual issues, and tinnitus, all of which may be related to her  aforementioned episodes.   CURRENT MEDICATIONS:  Include:  1. Vicodin p.r.n.  2. Meclizine p.r.n.  3. Singulair.   ALLERGIES:  SHE HAS ALLERGIES TO SULFA AND ERYTHROMYCIN.   SOCIAL HISTORY:  She is single.  She lives by herself.  She has no children.  She works at [employer].   FAMILY HISTORY:  Noncontributory.   PHYSICAL EXAMINATION:  She is a younger Caucasian female appearing her  stated age of 73.  Her blood pressure is 110/74 with a pulse of 59 lying, sitting it was 107/72  with a pulse of 64 at which time she complained of dizziness.  At 0 minutes,  it was 98/60 with a pulse now of 84.  Her blood pressure then improved to  104/77 with a pulse of 83 and heart rate then slowed a little bit to 77 with  a blood pressure of 98/76.  HEENT:  Demonstrated no icterus or xanthomata.  Neck veins were flat.  Carotids were brisk and full bilaterally without  bruits.  BACK:  Without kyphosis, scoliosis.  LUNGS:  Clear.  Heart sounds were regular without murmurs or  gallops.  ABDOMEN:  Soft with active bowel sounds without midline pulsation or  hepatosplenomegaly.  Femoral pulses were 2+.  Distal pulses were intact.  There is no clubbing,  cyanosis, or edema.  NEUROLOGIC:  Grossly normal, although her interactions were unusual in that  she seemed to be smiling and laughing almost at every thing I said.   Electrocardiogram dated today demonstrated sinus rhythm at 57 with intervals  of 0.17/0.09/0.41.  The electrocardiogram was otherwise normal.   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  She may also have a  component of postural orthostatic tachycardia, although this was not evident  here today.   Tilt table testing might be helpful for demonstration of neurocardiogenic  syncope in the aforementioned milieu; we could certainly do that, although  given her  history, I am not quite sure how much it will add.   In any case, she is to see you next Tuesday and I left it with her and her  family that if you thought it would be helpful in further supporting the  diagnosis, I would be glad to do that.   As related to interventions for her symptoms, I have suggested:  1. Increasing her fluid intake until her urine is clear.  2. Increasing her salt intake significantly.  3. Becoming increasingly aware of the prodrome.  4. Needing to address psychosocial issues as they frequently are      aggravating of propensity towards neurally mediated syncope.  5. We have given her the websites for potsplace.com, and NDRF is Sebastian Ache, NDRF.org, which are both websites for people      with dysautonomic conditions.   Jae Dire, thanks very much for asking me to see her.  I hope this letter finds  you well.  I look forward to hearing some more of your music.   Sincerely,     ______________________________  Duke Salvia, MD, [facility]    SCK/MedQ  /  Job #:  [id number]  DD:  07/07/2006 / DT:  07/07/2006

## 2011-02-06 NOTE — Letter (Signed)
December 31, 2006    Santina Evans A. Orlin Hilding, M.D.  [address]  [address]  [place], [place] [address]   RE:  Lori Hicks, Lori Hicks  MRN:  [id number]  /  DOB:  [birth year]   Dear Dr. Orlin Hilding:   [name] comes in.  She has presumptive dysautonomic syncope.  She was  doing quite well since her recent initiation of Florinef.  This worked  for about 2 months and over the last month she is having increasing  symptoms.   She also describes a very difficult relationship with her boss at work  around her needs to sit down periodically.   Her current medications include the Florinef 100 mg b.i.d., Topamax,  Claritin and Singulair.   On examination, her blood pressure is 118/79, pulse of 69.  Lungs were  clear.  Heart sounds were regular.  Electrocardiogram demonstrated sinus  rhythm at 69 with intervals of 0.16/0.09/0.38.  The axis was 80 degrees.   IMPRESSION:  1. Positive dysautonomia.  2. Migraine headaches.  3. Ebstein-Barr.  4. ITP.   [name] is doing well on her salt and water intake, it sounds like.  We  will plan to increase her Florinef to 200 mg twice daily, and we will  see her again in 2 weeks to review her blood pressure and her BMET, and  I will see her again in about 6 months' time.    Sincerely,      Duke Salvia, MD, [facility]  Electronically Signed    SCK/MedQ  DD: 12/31/2006  DT: 12/31/2006  Job #: [id number]

## 2011-04-21 ENCOUNTER — Encounter (HOSPITAL_COMMUNITY): Payer: Self-pay | Admitting: *Deleted

## 2011-04-28 ENCOUNTER — Encounter (HOSPITAL_COMMUNITY): Payer: Self-pay | Admitting: Obstetrics & Gynecology

## 2011-04-28 ENCOUNTER — Encounter (HOSPITAL_COMMUNITY): Payer: Self-pay | Admitting: Certified Registered"

## 2011-04-28 ENCOUNTER — Ambulatory Visit (HOSPITAL_COMMUNITY)
Admission: RE | Admit: 2011-04-28 | Discharge: 2011-04-28 | Disposition: A | Discharge status: Home / Self Care | Payer: Managed Care, Other (non HMO) | Source: Ambulatory Visit | Attending: Obstetrics & Gynecology | Admitting: Obstetrics & Gynecology

## 2011-04-28 ENCOUNTER — Encounter (HOSPITAL_COMMUNITY)
Admission: RE | Disposition: A | Discharge status: Home / Self Care | Payer: Self-pay | Source: Ambulatory Visit | Attending: Obstetrics & Gynecology

## 2011-04-28 DIAGNOSIS — Z302 Encounter for sterilization: Secondary | ICD-10-CM | POA: Insufficient documentation

## 2011-04-28 HISTORY — DX: Nausea with vomiting, unspecified: R11.2

## 2011-04-28 HISTORY — DX: Headache: R51

## 2011-04-28 HISTORY — DX: Nausea with vomiting, unspecified: Z98.890

## 2011-04-28 HISTORY — PX: TUBAL LIGATION: SHX77

## 2011-04-28 SURGERY — ESSURE TUBAL STERILIZATION
Wound class: Clean Contaminated

## 2011-04-28 MED ORDER — CHLOROPROCAINE HCL 1 % IJ SOLN
INTRAMUSCULAR | Status: DC | PRN
  Administered 2011-04-28: 30 mL

## 2011-04-28 MED ORDER — SODIUM CHLORIDE 0.9 % IR SOLN
Status: DC | PRN
  Administered 2011-04-28: 1

## 2011-04-28 SURGICAL SUPPLY — 12 items
CATH ROBINSON RED A/P 16FR (CATHETERS) ×2 IMPLANT
CLOTH BEACON ORANGE TIMEOUT ST (SAFETY) ×2 IMPLANT
CONTAINER PREFILL 10% NBF 60ML (FORM) IMPLANT
DRAPE UTILITY XL STRL (DRAPES) ×2 IMPLANT
GLOVE BIO SURGEON STRL SZ7 (GLOVE) ×2 IMPLANT
GLOVE BIOGEL PI IND STRL 7.0 (GLOVE) ×2 IMPLANT
GLOVE BIOGEL PI INDICATOR 7.0 (GLOVE) ×2
GOWN PREVENTION PLUS LG XLONG (DISPOSABLE) ×4 IMPLANT
KIT ESSURE FALLOPIAN CLOSURE (Ring) ×2 IMPLANT
PACK HYSTEROSCOPY LF (CUSTOM PROCEDURE TRAY) ×2 IMPLANT
TOWEL OR 17X24 6PK STRL BLUE (TOWEL DISPOSABLE) ×4 IMPLANT
WATER STERILE IRR 1000ML POUR (IV SOLUTION) ×2 IMPLANT

## 2011-04-28 NOTE — OR Nursing (Signed)
Vital Signs (Local)  0719:  HR-71 O2 SAT-100 BP-113/77 (98)  0724: HR-74 O2 SAT-100 BP-117/67 (89)  0729: HR-77 O2 SAT-99 BP-114/72 (93)  0734: HR-89 O2 SAT-100 BP-123/61 (96)  0739: HR-77 O2 SAT-99 BP-130/69 (92)  0744: HR-79 O2 SAT-99 BP-126/73 (100)  0749: HR-64 O2 SAT-97 BP-110/69 (87)  0754: HR-57 O2 SAT-96 BP-91/45 (59)

## 2011-04-28 NOTE — Op Note (Signed)
Preoperative diagnosis: Permanent sterilization desired Postop diagnosis: same Procedure: Hysteroscopic Essure sterilization Surgeon Dr. Shea Evans Assistant: None Anesthesia: paracervical block  Findings: Normal bilateral tubal osteii, normal endometrial cavity. Right tubal ostium had 6 coils and left tubal ostium had 2 coils, bilateral successfully placement.   Procedure Patient is 35 year old woman who desired permanent sterilization. All forms of contraception were reviewed. Procedure was discussed with patient including risks complications and need for followup in 3 months for HSG. Also reviewed with patient need for using contraception until HSG confirmed bilateral tubal blockage. Patient agreed informed written consent was obtained risks and complications of surgery including infection bleeding damage to internal organs reviewed. Patient was brought to the operating room, time out was performed. Patient was given lithotomy position. Partial prepped and draped in standard fashion. Cervix was exposed with speculum. Anterior lip of cervix was grasped with tenaculum. Bilateral paracervical block was given using 1% Nasocaine (30 cc). Cervical os was dilated to a #19 [place] dilator. Hysteroscope was introduced in the uterine cavity using saline for irrigation. Endometrial cavity had some buildup of fluffy endometrium but bilateral tubal ostia were visualized well. Essure device was opened. Hysteroscope needed to be changed due to problem with the inflow. 2 tenaculums were placed around cervical os which was too dilated and hence not allowing distention of endometrial cavity. Cavity distention improved.  Right tubal ostium was cannulated with Essure in standard fashion leaving 6 coil trailing at the ostium at the end of placement. The left tubal ostium was cannulated with Essure in standard fashion leaving 2 coils trailing at tubal ostium.  No other abnormal findings noted in the endometrium.  Hysteroscope was removed. No abnormal bleeding noted.  Estimated blood loss: minimal Specimen none Complications none Patient was brought to PACU in stable condition. She'll be discharged home and followup in office.

## 2011-04-28 NOTE — Progress Notes (Signed)
Pt surgery done under local anethesia pt arrived to PACU pale low bp.  Pt stated she needed to have a bowel movement and declined to use a bed pan.  Pt assisted to restroom via wheelchair pt was able to make her bowel movement and voided in restroom.  Pt bp 100/37 hr 52 oxygen 99% after using restroom.  Pt remain pale skin is clammy pt returned to her strecther in main pacu and monitors reapplied pt lying supine on her left side will continue to monitor.  Dr. Juliene Pina aware of pts status.

## 2011-04-28 NOTE — H&P (Signed)
  Lori Hicks is an 35 y.o. female. G1P0010 (one EAB) . Here for permanent sterilization with Essure. Does not future pregnancies, understands options.  Pertinent Gynecological History: Menses: flow is moderate, menses regular Contraception: none / absteinance DES exposure: denies Blood transfusions: none Sexually transmitted diseases: no past history Previous GYN Procedures: none  Last mammogram: n/a Last pap: normal OB History: G1, P0010. Patient's last menstrual period was 04/08/2011.    Past Medical History  Diagnosis Date  . PONV (postoperative nausea and vomiting)   . Headache     from POT syndrome    Past Surgical History  Procedure Date  . Tonsillectomy     as a child  . Nasal sinus surgery 2000  . Tonsillectomy and adenoidectomy     as a child    History reviewed. No pertinent family history.  Social History:  reports that she has never smoked. She does not have any smokeless tobacco history on file. She reports that she drinks alcohol. She reports that she does not use illicit drugs.  Allergies:  Allergies  Allergen Reactions  . Shellfish-Derived Products Anaphylaxis  . Erythromycin Other (See Comments)    Childhood reaction  . Latex     According to allergy tests done and the scratch tests as well  . Sulfonamide Derivatives Other (See Comments)    Childhood reaction    Prescriptions prior to admission  Medication Sig Dispense Refill  . norethindrone (AYGESTIN) 5 MG tablet Take 5 mg by mouth daily.        Marland Kitchen PRESCRIPTION MEDICATION Place 1 drop under the tongue daily. Allergy drops from [facility].         @ROS @ No SOB/chest pain/ HA/ vision changes/ LE pain or swelling/ etc.  Blood pressure 114/65, pulse 92, temperature 98.4 F (36.9 C), temperature source Oral, resp. rate 18, last menstrual period 04/08/2011, SpO2 99.00%. Physical exam:  A&O x 3, no acute distress. Pleasant HEENT neg, no thyromegaly Lungs CTA bilat CV RRR, A1S2 normal Abdo  soft, non tender, non acute Extr no edema/ tenderness Pelvic deferred today  Results for orders placed during the hospital encounter of 04/28/11 (from the past 24 hour(s))  PREGNANCY, URINE     Status: Normal   Collection Time   04/28/11  6:10 AM      Component Value Range   Preg Test, Ur NEGATIVE      Assessment/Plan: Undesired fertility. Options Mirena/other [facility] options/ LSTL/ Essure reviewed. Also discussed Vasectomy for spouse. Pt desures Essure hysteroscopic sterilization.  Lori Hicks 04/28/2011, 6:41 AM

## 2011-05-28 ENCOUNTER — Encounter (HOSPITAL_COMMUNITY): Payer: Self-pay | Admitting: Obstetrics & Gynecology

## 2011-06-18 LAB — BASIC METABOLIC PANEL
CO2: 26
Chloride: 114 — ABNORMAL HIGH
GFR calc Af Amer: >60
Glucose, Bld: 104 — ABNORMAL HIGH
Potassium: 3.7
Sodium: 144

## 2011-06-18 LAB — COMPREHENSIVE METABOLIC PANEL
ALT: 21
Albumin: 3.4 — ABNORMAL LOW
Alkaline Phosphatase: 35 — ABNORMAL LOW
Chloride: 114 — ABNORMAL HIGH
Glucose, Bld: 132 — ABNORMAL HIGH
Potassium: 3.2 — ABNORMAL LOW
Sodium: 142
Total Bilirubin: 1
Total Protein: 5.6 — ABNORMAL LOW

## 2011-06-18 LAB — URINALYSIS, ROUTINE W REFLEX MICROSCOPIC
Glucose, UA: NEGATIVE
Ketones, ur: NEGATIVE
Protein, ur: NEGATIVE
Urobilinogen, UA: 0.2

## 2011-06-18 LAB — CBC
HCT: 37
Hemoglobin: 12.9
Platelets: 148 — ABNORMAL LOW
RDW: 12.6
WBC: 5.6

## 2011-08-28 ENCOUNTER — Other Ambulatory Visit (HOSPITAL_COMMUNITY): Payer: Self-pay | Admitting: Obstetrics & Gynecology

## 2011-08-28 DIAGNOSIS — N971 Female infertility of tubal origin: Secondary | ICD-10-CM

## 2011-09-03 ENCOUNTER — Ambulatory Visit (HOSPITAL_COMMUNITY)
Admission: RE | Admit: 2011-09-03 | Discharge: 2011-09-03 | Disposition: A | Discharge status: Home / Self Care | Payer: Managed Care, Other (non HMO) | Source: Ambulatory Visit | Attending: Obstetrics & Gynecology | Admitting: Obstetrics & Gynecology

## 2011-09-03 DIAGNOSIS — N971 Female infertility of tubal origin: Secondary | ICD-10-CM

## 2011-09-03 DIAGNOSIS — Z3049 Encounter for surveillance of other contraceptives: Secondary | ICD-10-CM | POA: Insufficient documentation

## 2011-09-03 MED ORDER — IOHEXOL 300 MG/ML  SOLN: 14.0000 mL | Freq: Once | INTRAMUSCULAR | Status: AC | PRN

## 2011-11-19 ENCOUNTER — Other Ambulatory Visit: Payer: Self-pay | Admitting: Family Medicine

## 2011-11-19 ENCOUNTER — Other Ambulatory Visit (HOSPITAL_COMMUNITY)
Admission: RE | Admit: 2011-11-19 | Discharge: 2011-11-19 | Disposition: A | Discharge status: Home / Self Care | Payer: Managed Care, Other (non HMO) | Source: Ambulatory Visit | Attending: Family Medicine | Admitting: Family Medicine

## 2011-11-19 DIAGNOSIS — Z124 Encounter for screening for malignant neoplasm of cervix: Secondary | ICD-10-CM | POA: Insufficient documentation

## 2011-11-24 ENCOUNTER — Other Ambulatory Visit: Payer: Self-pay | Admitting: Family Medicine

## 2011-11-26 ENCOUNTER — Ambulatory Visit
Admission: RE | Admit: 2011-11-26 | Discharge: 2011-11-26 | Disposition: A | Discharge status: Home / Self Care | Payer: Managed Care, Other (non HMO) | Source: Ambulatory Visit | Attending: Family Medicine | Admitting: Family Medicine

## 2011-12-02 ENCOUNTER — Ambulatory Visit
Admission: RE | Admit: 2011-12-02 | Discharge: 2011-12-02 | Disposition: A | Discharge status: Home / Self Care | Payer: Managed Care, Other (non HMO) | Source: Ambulatory Visit | Attending: Family Medicine | Admitting: Family Medicine

## 2011-12-04 ENCOUNTER — Other Ambulatory Visit: Payer: Managed Care, Other (non HMO)

## 2012-07-01 ENCOUNTER — Ambulatory Visit (INDEPENDENT_AMBULATORY_CARE_PROVIDER_SITE_OTHER): Payer: Managed Care, Other (non HMO) | Admitting: Family Medicine

## 2012-07-01 ENCOUNTER — Encounter (HOSPITAL_BASED_OUTPATIENT_CLINIC_OR_DEPARTMENT_OTHER): Payer: Self-pay | Admitting: *Deleted

## 2012-07-01 ENCOUNTER — Emergency Department (HOSPITAL_BASED_OUTPATIENT_CLINIC_OR_DEPARTMENT_OTHER)
Admission: EM | Admit: 2012-07-01 | Discharge: 2012-07-02 | Disposition: A | Discharge status: Home / Self Care | Payer: Managed Care, Other (non HMO) | Attending: Emergency Medicine | Admitting: Emergency Medicine

## 2012-07-01 VITALS — BP 115/76 | HR 76 | Temp 98.5°F | Resp 16 | Ht 63.0 in | Wt 201.0 lb

## 2012-07-01 DIAGNOSIS — Z809 Family history of malignant neoplasm, unspecified: Secondary | ICD-10-CM | POA: Insufficient documentation

## 2012-07-01 DIAGNOSIS — I498 Other specified cardiac arrhythmias: Secondary | ICD-10-CM | POA: Insufficient documentation

## 2012-07-01 DIAGNOSIS — R51 Headache: Secondary | ICD-10-CM

## 2012-07-01 DIAGNOSIS — Z91040 Latex allergy status: Secondary | ICD-10-CM | POA: Insufficient documentation

## 2012-07-01 DIAGNOSIS — Z823 Family history of stroke: Secondary | ICD-10-CM | POA: Insufficient documentation

## 2012-07-01 DIAGNOSIS — Z91013 Allergy to seafood: Secondary | ICD-10-CM | POA: Insufficient documentation

## 2012-07-01 DIAGNOSIS — Z8673 Personal history of transient ischemic attack (TIA), and cerebral infarction without residual deficits: Secondary | ICD-10-CM | POA: Insufficient documentation

## 2012-07-01 DIAGNOSIS — G90A Postural orthostatic tachycardia syndrome (POTS): Secondary | ICD-10-CM

## 2012-07-01 DIAGNOSIS — Z882 Allergy status to sulfonamides status: Secondary | ICD-10-CM | POA: Insufficient documentation

## 2012-07-01 DIAGNOSIS — Z825 Family history of asthma and other chronic lower respiratory diseases: Secondary | ICD-10-CM | POA: Insufficient documentation

## 2012-07-01 DIAGNOSIS — Z8249 Family history of ischemic heart disease and other diseases of the circulatory system: Secondary | ICD-10-CM | POA: Insufficient documentation

## 2012-07-01 DIAGNOSIS — Z833 Family history of diabetes mellitus: Secondary | ICD-10-CM | POA: Insufficient documentation

## 2012-07-01 DIAGNOSIS — Z881 Allergy status to other antibiotic agents status: Secondary | ICD-10-CM | POA: Insufficient documentation

## 2012-07-01 DIAGNOSIS — I951 Orthostatic hypotension: Secondary | ICD-10-CM

## 2012-07-01 DIAGNOSIS — R000 Tachycardia, unspecified: Secondary | ICD-10-CM

## 2012-07-01 MED ORDER — SODIUM CHLORIDE 0.9 % IV BOLUS (SEPSIS)
1000.0000 mL | Freq: Once | INTRAVENOUS | Status: AC
  Administered 2012-07-01: 1000 mL via INTRAVENOUS

## 2012-07-01 NOTE — ED Provider Notes (Signed)
History     CSN: [id number]  Arrival date & time 07/01/12  1933   First MD Initiated Contact with Patient 07/01/12 2159      Chief Complaint  Patient presents with  . Headache    (Consider location/radiation/quality/duration/timing/severity/associated sxs/prior treatment) HPI Comments: A. she has a history of pots syndrome and presents with her typical POTS type flareup. She states it always presents with a headache. She states the headache started yesterday has gradually gotten worse throughout today. She states that normally she gets IV fluids and her symptoms improved. She denies any recent head injuries. Denies any fevers or chills. Denies any neck pain or stiffness. Denies any other recent illnesses. She was seen in her primary care physician within a repair for IV fluids. She states the headache is gradually been getting worse since yesterday. She's tried ibuprofen without relief  Patient is a 36 y.o. female presenting with headaches. The history is provided by the patient.  Headache  Pertinent negatives include no fever, no shortness of breath, no nausea and no vomiting.    Past Medical History  Diagnosis Date  . PONV (postoperative nausea and vomiting)   . Headache     from POT syndrome  . Allergy   . Stroke     MRI revealed possibility.    Past Surgical History  Procedure Date  . Tonsillectomy     as a child  . Nasal sinus surgery 2000  . Tonsillectomy and adenoidectomy     as a child  . Tubal ligation 04/28/2011    Procedure: ESSURE TUBAL STERILIZATION;  Surgeon: Robley Fries;  Location: [facility];  Service: Gynecology;  Laterality: N/A;  Cervical Block Only    Family History  Problem Relation Age of Onset  . Stroke Mother   . Diabetes Father   . Asthma Brother   . Cancer Maternal Grandfather   . Heart disease Paternal Grandfather     History  Substance Use Topics  . Smoking status: Never Smoker   . Smokeless tobacco: Not on file  . Alcohol Use: Yes     less than once a week    OB History    Grav Para Term Preterm Abortions TAB SAB Ect Mult Living                  Review of Systems  Constitutional: Negative for fever, chills, diaphoresis and fatigue.  HENT: Negative for congestion, rhinorrhea and sneezing.   Eyes: Negative.   Respiratory: Negative for cough, chest tightness and shortness of breath.   Cardiovascular: Negative for chest pain and leg swelling.  Gastrointestinal: Negative for nausea, vomiting, abdominal pain, diarrhea and blood in stool.  Genitourinary: Negative for frequency, hematuria, flank pain and difficulty urinating.  Musculoskeletal: Negative for back pain and arthralgias.  Skin: Negative for rash.  Neurological: Positive for headaches. Negative for dizziness, speech difficulty, weakness and numbness.    Allergies  Shellfish-derived products; Erythromycin; Latex; and Sulfonamide derivatives  Home Medications   Current Outpatient Rx  Name Route Sig Dispense Refill  . TYLENOL PO Oral Take 1,000 mg by mouth as needed.    Marland Kitchen VITAMIN D PO Oral Take 2,000 mg by mouth daily.    Marland Kitchen ADVIL PO Oral Take 400 mg by mouth as needed.    Marland Kitchen ONE-DAILY MULTI VITAMINS PO TABS Oral Take 1 tablet by mouth daily.    Marland Kitchen PRESCRIPTION MEDICATION Sublingual Place 1 drop under the tongue daily. Allergy drops from [facility].  BP 137/103  Pulse 76  Temp 97.8 F (36.6 C) (Oral)  Resp 20  SpO2 100%  LMP 07/01/2012  Physical Exam  Constitutional: She is oriented to person, place, and time. She appears well-developed and well-nourished.  HENT:  Head: Normocephalic and atraumatic.  Eyes: Pupils are equal, round, and reactive to light.  Neck: Normal range of motion. Neck supple.  Cardiovascular: Normal rate, regular rhythm and normal heart sounds.   Pulmonary/Chest: Effort normal and breath sounds normal. No respiratory distress. She has no wheezes. She has no rales. She exhibits no tenderness.  Abdominal: Soft. Bowel  sounds are normal. There is no tenderness. There is no rebound and no guarding.  Musculoskeletal: Normal range of motion. She exhibits no edema.  Lymphadenopathy:    She has no cervical adenopathy.  Neurological: She is alert and oriented to person, place, and time. She has normal strength. No cranial nerve deficit or sensory deficit. GCS eye subscore is 4. GCS verbal subscore is 5. GCS motor subscore is 6.  Skin: Skin is warm and dry. No rash noted.  Psychiatric: She has a normal mood and affect.    ED Course  Procedures (including critical care time)  Labs Reviewed - No data to display No results found.   1. POTS (postural orthostatic tachycardia syndrome)       MDM  Pt feeling much better after fluids.  Will d/c.        Rolan Bucco, MD 07/01/12 [phone]

## 2012-07-01 NOTE — ED Notes (Signed)
Headache since yesterday. States she has a hx of POTS. Drove herself here.

## 2012-07-01 NOTE — Progress Notes (Signed)
  Subjective:    Patient ID: Modena Nunnery, female    DOB: 10-04-1975, 36 y.o.   MRN: [id number]  HPI MARCE SCHARTZ is a 36 y.o. female  Hx of POTS syndrome (postural orthostatic tachycardia syndrome) - followed by Dr. Graciela Husbands. Flairs usually cause headache - always lightheaded and dizzy.   Current headache since yesterday.  Tried sleep yesterday, alternating advil and tylenol, and drinking fluids - not having improvement. Usually in past has to have IVF if not helping.  Last episode 2 years ago.  Sore throat this afternoon.  No fevers.  Has been taking ibuprofen 400mg  every 8 hours, tylenol 100omg every 8 hours.  Current flair - typical POTS syndrome flair, but not responding.  Works at [employer].  No known sick contacts.    Review of Systems As above.      Objective:   Physical Exam  Constitutional: She is oriented to person, place, and time. She appears well-developed and well-nourished.  HENT:  Head: Normocephalic and atraumatic.  Right Ear: External ear normal.  Left Ear: External ear normal.  Mouth/Throat: Oropharynx is clear and moist. No oropharyngeal exudate.  Eyes: EOM are normal. Pupils are equal, round, and reactive to light.  Cardiovascular: Normal rate, regular rhythm, normal heart sounds and intact distal pulses.   Pulmonary/Chest: Effort normal and breath sounds normal.  Neurological: She is alert and oriented to person, place, and time.  Skin: Skin is warm and dry. No rash noted.  Psychiatric: She has a normal mood and affect. Her behavior is normal.      Assessment & Plan:  DAYLANI DEBLOIS is a 36 y.o. female Hx of POTS syndrome, with current flair not responding to ort and nsaids, rest, tylenol.  By history IVF usually help at this stage.  Due to our office being closed at present and possible need for prolonged time for IVF, advised to have this done through ER - I called [facility] and advised provider who will be  looking out for patient. patient advised, understanding expressed.

## 2012-09-21 HISTORY — PX: OTHER SURGICAL HISTORY: SHX169

## 2012-12-02 ENCOUNTER — Other Ambulatory Visit (HOSPITAL_COMMUNITY)
Admission: RE | Admit: 2012-12-02 | Discharge: 2012-12-02 | Disposition: A | Discharge status: Home / Self Care | Payer: Managed Care, Other (non HMO) | Source: Ambulatory Visit | Attending: Family Medicine | Admitting: Family Medicine

## 2012-12-02 ENCOUNTER — Other Ambulatory Visit: Payer: Self-pay | Admitting: Family Medicine

## 2012-12-02 DIAGNOSIS — Z124 Encounter for screening for malignant neoplasm of cervix: Secondary | ICD-10-CM | POA: Insufficient documentation

## 2014-01-10 ENCOUNTER — Other Ambulatory Visit (HOSPITAL_COMMUNITY)
Admission: RE | Admit: 2014-01-10 | Discharge: 2014-01-10 | Disposition: A | Discharge status: Home / Self Care | Payer: Managed Care, Other (non HMO) | Source: Ambulatory Visit | Attending: Family Medicine | Admitting: Family Medicine

## 2014-01-10 ENCOUNTER — Other Ambulatory Visit: Payer: Self-pay | Admitting: Family Medicine

## 2014-01-10 DIAGNOSIS — Z124 Encounter for screening for malignant neoplasm of cervix: Secondary | ICD-10-CM | POA: Insufficient documentation

## 2015-01-18 ENCOUNTER — Other Ambulatory Visit (HOSPITAL_COMMUNITY)
Admission: RE | Admit: 2015-01-18 | Discharge: 2015-01-18 | Disposition: A | Discharge status: Home / Self Care | Payer: Managed Care, Other (non HMO) | Source: Ambulatory Visit | Attending: Family Medicine | Admitting: Family Medicine

## 2015-01-18 ENCOUNTER — Other Ambulatory Visit: Payer: Self-pay | Admitting: Family Medicine

## 2015-01-18 DIAGNOSIS — Z124 Encounter for screening for malignant neoplasm of cervix: Secondary | ICD-10-CM | POA: Insufficient documentation

## 2015-01-22 LAB — CYTOLOGY - PAP

## 2016-01-27 ENCOUNTER — Other Ambulatory Visit (HOSPITAL_COMMUNITY)
Admission: RE | Admit: 2016-01-27 | Discharge: 2016-01-27 | Disposition: A | Discharge status: Home / Self Care | Payer: Managed Care, Other (non HMO) | Source: Ambulatory Visit | Attending: Family Medicine | Admitting: Family Medicine

## 2016-01-27 ENCOUNTER — Other Ambulatory Visit: Payer: Self-pay | Admitting: Family Medicine

## 2016-01-27 DIAGNOSIS — Z01411 Encounter for gynecological examination (general) (routine) with abnormal findings: Secondary | ICD-10-CM | POA: Diagnosis present

## 2016-01-27 DIAGNOSIS — Z1151 Encounter for screening for human papillomavirus (HPV): Secondary | ICD-10-CM | POA: Insufficient documentation

## 2016-01-28 LAB — CYTOLOGY - PAP

## 2016-02-03 ENCOUNTER — Other Ambulatory Visit: Payer: Self-pay

## 2016-02-03 DIAGNOSIS — Z1231 Encounter for screening mammogram for malignant neoplasm of breast: Secondary | ICD-10-CM

## 2016-02-13 ENCOUNTER — Ambulatory Visit: Payer: Managed Care, Other (non HMO)

## 2016-03-26 ENCOUNTER — Ambulatory Visit
Admission: RE | Admit: 2016-03-26 | Discharge: 2016-03-26 | Disposition: A | Discharge status: Home / Self Care | Payer: Managed Care, Other (non HMO) | Source: Ambulatory Visit

## 2016-03-26 DIAGNOSIS — Z1231 Encounter for screening mammogram for malignant neoplasm of breast: Secondary | ICD-10-CM

## 2017-06-22 ENCOUNTER — Other Ambulatory Visit: Payer: Self-pay | Admitting: Family Medicine

## 2017-06-22 DIAGNOSIS — Z1231 Encounter for screening mammogram for malignant neoplasm of breast: Secondary | ICD-10-CM

## 2017-07-02 ENCOUNTER — Ambulatory Visit
Admission: RE | Admit: 2017-07-02 | Discharge: 2017-07-02 | Disposition: A | Discharge status: Home / Self Care | Payer: Managed Care, Other (non HMO) | Source: Ambulatory Visit | Attending: Family Medicine | Admitting: Family Medicine

## 2017-07-02 DIAGNOSIS — Z1231 Encounter for screening mammogram for malignant neoplasm of breast: Secondary | ICD-10-CM

## 2017-09-13 ENCOUNTER — Other Ambulatory Visit: Payer: Self-pay | Admitting: Family Medicine

## 2017-09-13 DIAGNOSIS — R1031 Right lower quadrant pain: Secondary | ICD-10-CM

## 2017-09-15 ENCOUNTER — Other Ambulatory Visit: Payer: Self-pay | Admitting: Family Medicine

## 2017-09-15 ENCOUNTER — Ambulatory Visit
Admission: RE | Admit: 2017-09-15 | Discharge: 2017-09-15 | Disposition: A | Discharge status: Home / Self Care | Payer: Managed Care, Other (non HMO) | Source: Ambulatory Visit | Attending: Family Medicine | Admitting: Family Medicine

## 2017-09-15 DIAGNOSIS — R1031 Right lower quadrant pain: Secondary | ICD-10-CM

## 2018-02-09 NOTE — H&P (Addendum)
Lori Hicks is an 42 y.o. female presents for hysterectomy.  Pt with chronic pelvic pain.  She is s/p essure and initially pain was intermittent but now constant since November.  R>L.  Menstrual History: No LMP recorded.    Past Medical History:  Diagnosis Date  . Allergy   . Headache(784.0)    from POT syndrome  . PONV (postoperative nausea and vomiting)   . Stroke [facility])    MRI revealed possibility.    Past Surgical History:  Procedure Laterality Date  . NASAL SINUS SURGERY  2000  . TONSILLECTOMY     as a child  . TONSILLECTOMY AND ADENOIDECTOMY     as a child  . TUBAL LIGATION  04/28/2011   Procedure: ESSURE TUBAL STERILIZATION;  Surgeon: Elveria Royals;  Location: [facility];  Service: Gynecology;  Laterality: N/A;  Cervical Block Only    Family History  Problem Relation Age of Onset  . Stroke Mother   . Diabetes Father   . Asthma Brother   . Cancer Maternal Grandfather   . Heart disease Paternal Grandfather     Social History:  reports that she has never smoked. She does not have any smokeless tobacco history on file. She reports that she drinks alcohol. She reports that she does not use drugs.  Allergies:  Allergies  Allergen Reactions  . Shellfish-Derived Products Anaphylaxis  . Erythromycin Other (See Comments)    Childhood reaction  . Latex     According to allergy tests done and the scratch tests as well  . Sulfonamide Derivatives Other (See Comments)    Childhood reaction    No medications prior to admission.    ROS  Physical Exam Gen - NAD CV - RRR Lungs - clear Abd - soft, NT Ext - NT PV - uterus NT, pt tender to bimanual exam  [place]:  Small 2.3 cm fibroid.  essure seen. No adnexal mass or free fluid  Assessment/Plan: Chronic pelvic pain LAVH/BS, possible TAH R/b/a reviewed, questions answered, informed consent Marylynn Pearson 02/09/2018, 1:16 PM

## 2018-02-16 ENCOUNTER — Encounter (HOSPITAL_COMMUNITY): Payer: Self-pay

## 2018-02-16 NOTE — Patient Instructions (Addendum)
Your procedure is scheduled on: Tuesday, February 22, 2018   [place] WE ARE LOCATED AT [place]   Call this number if you have problems the morning of surgery [phone]   Do not eat food or drink liquids :After Midnight.FOLLOW ALL BOWEL PREP AND CLEAR LIQUID INSTRUCTIONS FROM DR ATKINS   Do NOT smoke after Midnight   Take these medicines the morning of surgery with A SIP OF WATER: Loratadine if needed                               You may not have any metal on your body including hair pins, jewelry, and body piercings             Do not wear make-up, lotions, powders, perfumes/cologne, or deodorant             Do not wear nail polish.  Do not shave  48 hours prior to surgery.                Do not bring valuables to the hospital. [place].   Contacts, dentures or bridgework may not be worn into surgery.   Leave suitcase in the car. After surgery it may be brought to your room.   Special Instructions: YOU ARE SPENDING THE NIGHT               Please read over the following fact sheets you were given:  [facility] - Preparing for Surgery Before surgery, you can play an important role.  Because skin is not sterile, your skin needs to be as free of germs as possible.  You can reduce the number of germs on your skin by washing with CHG (chlorahexidine gluconate) soap before surgery.  CHG is an antiseptic cleaner which kills germs and bonds with the skin to continue killing germs even after washing. Please DO NOT use if you have an allergy to CHG or antibacterial soaps.  If your skin becomes reddened/irritated stop using the CHG and inform your nurse when you arrive at Short Stay. Do not shave (including legs and underarms) for at least 48 hours prior to the first CHG shower.  You may shave your face/neck.  Please follow these instructions carefully:  1.  Shower  with CHG Soap the night before surgery and the  morning of surgery.  2.  If you choose to wash your hair, wash your hair first as usual with your normal  shampoo.  3.  After you shampoo, rinse your hair and body thoroughly to remove the shampoo.                             4.  Use CHG as you would any other liquid soap.  You can apply chg directly to the skin and wash.  Gently with a scrungie or clean washcloth.  5.  Apply the CHG Soap to your body ONLY FROM THE NECK DOWN.   Do   not use on face/ open                           Wound or open sores. Avoid contact with eyes, ears  mouth and   genitals (private parts).                       Wash face,  Genitals (private parts) with your normal soap.             6.  Wash thoroughly, paying special attention to the area where your    surgery  will be performed.  7.  Thoroughly rinse your body with warm water from the neck down.  8.  DO NOT shower/wash with your normal soap after using and rinsing off the CHG Soap.                9.  Pat yourself dry with a clean towel.            10.  Wear clean pajamas.            11.  Place clean sheets on your bed the night of your first shower and do not  sleep with pets. Day of Surgery : Do not apply any lotions/deodorants the morning of surgery.  Please wear clean clothes to the hospital/surgery center.  FAILURE TO FOLLOW THESE INSTRUCTIONS MAY RESULT IN THE CANCELLATION OF YOUR SURGERY  PATIENT SIGNATURE_________________________________  NURSE SIGNATURE__________________________________  ________________________________________________________________________  WHAT IS A BLOOD TRANSFUSION? Blood Transfusion Information  A transfusion is the replacement of blood or some of its parts. Blood is made up of multiple cells which provide different functions.  Red blood cells carry oxygen and are used for blood loss replacement.  White blood cells fight against infection.  Platelets control bleeding.  Plasma  helps clot blood.  Other blood products are available for specialized needs, such as hemophilia or other clotting disorders. BEFORE THE TRANSFUSION  Who gives blood for transfusions?   Healthy volunteers who are fully evaluated to make sure their blood is safe. This is blood bank blood. Transfusion therapy is the safest it has ever been in the practice of medicine. Before blood is taken from a donor, a complete history is taken to make sure that person has no history of diseases nor engages in risky social behavior (examples are intravenous drug use or sexual activity with multiple partners). The donor's travel history is screened to minimize risk of transmitting infections, such as malaria. The donated blood is tested for signs of infectious diseases, such as HIV and hepatitis. The blood is then tested to be sure it is compatible with you in order to minimize the chance of a transfusion reaction. If you or a relative donates blood, this is often done in anticipation of surgery and is not appropriate for emergency situations. It takes many days to process the donated blood. RISKS AND COMPLICATIONS Although transfusion therapy is very safe and saves many lives, the main dangers of transfusion include:   Getting an infectious disease.  Developing a transfusion reaction. This is an allergic reaction to something in the blood you were given. Every precaution is taken to prevent this. The decision to have a blood transfusion has been considered carefully by your caregiver before blood is given. Blood is not given unless the benefits outweigh the risks. AFTER THE TRANSFUSION  Right after receiving a blood transfusion, you will usually feel much better and more energetic. This is especially true if your red blood cells have gotten low (anemic). The transfusion raises the level of the red blood cells which carry oxygen, and this usually causes an energy increase.  The nurse administering the transfusion  will monitor you carefully for complications. HOME CARE INSTRUCTIONS  No special instructions are needed after a transfusion. You may find your energy is better. Speak with your caregiver about any limitations on activity for underlying diseases you may have. SEEK MEDICAL CARE IF:   Your condition is not improving after your transfusion.  You develop redness or irritation at the intravenous (IV) site. SEEK IMMEDIATE MEDICAL CARE IF:  Any of the following symptoms occur over the next 12 hours:  Shaking chills.  You have a temperature by mouth above 102 F (38.9 C), not controlled by medicine.  Chest, back, or muscle pain.  People around you feel you are not acting correctly or are confused.  Shortness of breath or difficulty breathing.  Dizziness and fainting.  You get a rash or develop hives.  You have a decrease in urine output.  Your urine turns a dark color or changes to pink, red, or brown. Any of the following symptoms occur over the next 10 days:  You have a temperature by mouth above 102 F (38.9 C), not controlled by medicine.  Shortness of breath.  Weakness after normal activity.  The white part of the eye turns yellow (jaundice).  You have a decrease in the amount of urine or are urinating less often.  Your urine turns a dark color or changes to pink, red, or brown. Document Released: 09/04/2000 Document Revised: 11/30/2011 Document Reviewed: 04/23/2008 [facility] Patient Information 2014 [place], [place].  _______________________________________________________________________

## 2018-02-17 ENCOUNTER — Other Ambulatory Visit: Payer: Self-pay

## 2018-02-17 ENCOUNTER — Encounter (HOSPITAL_COMMUNITY): Payer: Self-pay

## 2018-02-17 ENCOUNTER — Encounter (HOSPITAL_COMMUNITY)
Admission: RE | Admit: 2018-02-17 | Discharge: 2018-02-17 | Disposition: A | Discharge status: Home / Self Care | Payer: 59 | Source: Ambulatory Visit | Attending: Obstetrics and Gynecology | Admitting: Obstetrics and Gynecology

## 2018-02-17 DIAGNOSIS — I951 Orthostatic hypotension: Secondary | ICD-10-CM | POA: Insufficient documentation

## 2018-02-17 DIAGNOSIS — R102 Pelvic and perineal pain: Secondary | ICD-10-CM | POA: Diagnosis not present

## 2018-02-17 DIAGNOSIS — Z01818 Encounter for other preprocedural examination: Secondary | ICD-10-CM | POA: Insufficient documentation

## 2018-02-17 HISTORY — DX: Tachycardia, unspecified: R00.0

## 2018-02-17 HISTORY — DX: Orthostatic hypotension: I95.1

## 2018-02-17 HISTORY — DX: Chronic fatigue, unspecified: R53.82

## 2018-02-17 HISTORY — DX: Unspecified asthma, uncomplicated: J45.909

## 2018-02-17 HISTORY — DX: Personal history of diseases of the blood and blood-forming organs and certain disorders involving the immune mechanism: Z86.2

## 2018-02-17 HISTORY — DX: Other specified cardiac arrhythmias: I49.8

## 2018-02-17 HISTORY — DX: Benign neoplasm of connective and other soft tissue, unspecified: D21.9

## 2018-02-17 HISTORY — DX: Dorsalgia, unspecified: M54.9

## 2018-02-17 HISTORY — DX: Postural orthostatic tachycardia syndrome (POTS): G90.A

## 2018-02-17 LAB — BASIC METABOLIC PANEL
Anion gap: 7 (ref 5–15)
BUN: 20 mg/dL (ref 6–20)
CALCIUM: 8.8 mg/dL — AB (ref 8.9–10.3)
CO2: 25 mmol/L (ref 22–32)
CREATININE: 0.7 mg/dL (ref 0.44–1.00)
Chloride: 109 mmol/L (ref 101–111)
GFR calc non Af Amer: >60 mL/min (ref >60)
Glucose, Bld: 95 mg/dL (ref 65–99)
Potassium: 4.3 mmol/L (ref 3.5–5.1)
SODIUM: 141 mmol/L (ref 135–145)

## 2018-02-17 LAB — CBC
HCT: 40.9 % (ref 36.0–46.0)
HEMOGLOBIN: 13.7 g/dL (ref 12.0–15.0)
MCH: 30.6 pg (ref 26.0–34.0)
MCHC: 33.5 g/dL (ref 30.0–36.0)
MCV: 91.5 fL (ref 78.0–100.0)
Platelets: 193 10*3/uL (ref 150–400)
RBC: 4.47 MIL/uL (ref 3.87–5.11)
RDW: 12.4 % (ref 11.5–15.5)
WBC: 5.3 10*3/uL (ref 4.0–10.5)

## 2018-02-17 NOTE — Progress Notes (Signed)
NPO AFTER MIDNIGHT ARRIVE 530 AM [place] 02-22-18  MEDS TO TAKE LORATADINE PRN WENT OVER OVERNIGHT STAY INSTRUCTIONS WITH PATIENT AND INSTRUCTIONS SHEET GIVEN TO PATIENT WITH OVERNIGHT STAY INSTRUCTIONS.  CONSULTED WITH ANESTHESIA DR Winfield Cunas CONCERNING PATINENT HISTOR IF ORTHOSTATIC HYPOTENSION HYPOTENSION TACHARACDIA AND ITP AGE 42 MONTHS SEE PROGRESS NOTE.  NO DAY OF SURGERY LABS NEEDED.

## 2018-02-17 NOTE — Progress Notes (Signed)
SPOKE WITH DR Winfield Cunas MDA AND MADE  AWARE PATIENT WITH HISTORY OF POSTURAL ORTHOSTATIC HYPOTENSION TACHACARDIA AND ITP AGE 42 MONTHS AND NO PROBLEMS WITH ITP SINCE AGE 42 MONTHS, PATIENT TO GET EKG AND BMET PER DR Winfield Cunas ORDER PLACED IN Epic.

## 2018-02-18 LAB — ABO/RH: ABO/RH(D): O POS

## 2018-02-21 NOTE — Progress Notes (Signed)
Final ekg dated 02-17-2018 in epic.

## 2018-02-22 ENCOUNTER — Encounter (HOSPITAL_BASED_OUTPATIENT_CLINIC_OR_DEPARTMENT_OTHER)
Admission: RE | Disposition: A | Discharge status: Home / Self Care | Payer: Self-pay | Source: Ambulatory Visit | Attending: Obstetrics and Gynecology

## 2018-02-22 ENCOUNTER — Ambulatory Visit (HOSPITAL_BASED_OUTPATIENT_CLINIC_OR_DEPARTMENT_OTHER): Payer: No Typology Code available for payment source | Admitting: Anesthesiology

## 2018-02-22 ENCOUNTER — Observation Stay (HOSPITAL_BASED_OUTPATIENT_CLINIC_OR_DEPARTMENT_OTHER)
Admission: RE | Admit: 2018-02-22 | Discharge: 2018-02-23 | Disposition: A | Discharge status: Home / Self Care | Payer: No Typology Code available for payment source | Source: Ambulatory Visit | Attending: Obstetrics and Gynecology | Admitting: Obstetrics and Gynecology

## 2018-02-22 ENCOUNTER — Encounter (HOSPITAL_BASED_OUTPATIENT_CLINIC_OR_DEPARTMENT_OTHER): Payer: Self-pay | Admitting: *Deleted

## 2018-02-22 DIAGNOSIS — R102 Pelvic and perineal pain: Secondary | ICD-10-CM | POA: Diagnosis present

## 2018-02-22 DIAGNOSIS — D259 Leiomyoma of uterus, unspecified: Secondary | ICD-10-CM | POA: Insufficient documentation

## 2018-02-22 DIAGNOSIS — I10 Essential (primary) hypertension: Secondary | ICD-10-CM | POA: Diagnosis not present

## 2018-02-22 DIAGNOSIS — N946 Dysmenorrhea, unspecified: Secondary | ICD-10-CM | POA: Diagnosis not present

## 2018-02-22 DIAGNOSIS — N920 Excessive and frequent menstruation with regular cycle: Principal | ICD-10-CM | POA: Insufficient documentation

## 2018-02-22 DIAGNOSIS — Z8673 Personal history of transient ischemic attack (TIA), and cerebral infarction without residual deficits: Secondary | ICD-10-CM | POA: Diagnosis not present

## 2018-02-22 DIAGNOSIS — N840 Polyp of corpus uteri: Secondary | ICD-10-CM | POA: Diagnosis not present

## 2018-02-22 HISTORY — PX: LAPAROSCOPIC VAGINAL HYSTERECTOMY WITH SALPINGECTOMY: SHX6680

## 2018-02-22 LAB — TYPE AND SCREEN
ABO/RH(D): O POS
ANTIBODY SCREEN: NEGATIVE

## 2018-02-22 SURGERY — HYSTERECTOMY, VAGINAL, LAPAROSCOPY-ASSISTED, WITH SALPINGECTOMY
Anesthesia: General | Site: Abdomen | Laterality: Bilateral

## 2018-02-22 MED ORDER — KETOROLAC TROMETHAMINE 30 MG/ML IJ SOLN
INTRAMUSCULAR | Status: DC | PRN
  Administered 2018-02-22: 30 mg via INTRAVENOUS

## 2018-02-22 MED ORDER — ACETAMINOPHEN 500 MG PO TABS
ORAL_TABLET | ORAL | Status: AC
  Filled 2018-02-22: qty 2

## 2018-02-22 MED ORDER — ONDANSETRON HCL 4 MG/2ML IJ SOLN
INTRAMUSCULAR | Status: AC
  Filled 2018-02-22: qty 2

## 2018-02-22 MED ORDER — SUGAMMADEX SODIUM 200 MG/2ML IV SOLN
INTRAVENOUS | Status: AC
  Filled 2018-02-22: qty 2

## 2018-02-22 MED ORDER — KETOROLAC TROMETHAMINE 30 MG/ML IJ SOLN
INTRAMUSCULAR | Status: AC
  Filled 2018-02-22: qty 1

## 2018-02-22 MED ORDER — HYDROMORPHONE HCL 1 MG/ML IJ SOLN
INTRAMUSCULAR | Status: AC
  Filled 2018-02-22: qty 1

## 2018-02-22 MED ORDER — PROMETHAZINE HCL 25 MG/ML IJ SOLN
INTRAMUSCULAR | Status: AC
  Filled 2018-02-22: qty 1

## 2018-02-22 MED ORDER — ONDANSETRON HCL 4 MG PO TABS
4.0000 mg | ORAL_TABLET | Freq: Four times a day (QID) | ORAL | Status: DC | PRN
  Filled 2018-02-22: qty 1

## 2018-02-22 MED ORDER — GLYCOPYRROLATE 0.2 MG/ML IJ SOLN
INTRAMUSCULAR | Status: DC | PRN
  Administered 2018-02-22: 0.2 mg via INTRAVENOUS

## 2018-02-22 MED ORDER — PROMETHAZINE HCL 25 MG/ML IJ SOLN
6.2500 mg | INTRAMUSCULAR | Status: DC | PRN
  Administered 2018-02-22: 6.25 mg via INTRAVENOUS
  Filled 2018-02-22: qty 1

## 2018-02-22 MED ORDER — MEPERIDINE HCL 25 MG/ML IJ SOLN
6.2500 mg | INTRAMUSCULAR | Status: DC | PRN
  Filled 2018-02-22: qty 1

## 2018-02-22 MED ORDER — IBUPROFEN 600 MG PO TABS
600.0000 mg | ORAL_TABLET | Freq: Four times a day (QID) | ORAL | Status: DC | PRN
  Filled 2018-02-22: qty 1

## 2018-02-22 MED ORDER — BUPIVACAINE HCL (PF) 0.25 % IJ SOLN
INTRAMUSCULAR | Status: DC | PRN
  Administered 2018-02-22: 4 mL

## 2018-02-22 MED ORDER — PROPOFOL 10 MG/ML IV BOLUS
INTRAVENOUS | Status: DC | PRN
  Administered 2018-02-22: 150 mg via INTRAVENOUS

## 2018-02-22 MED ORDER — FENTANYL CITRATE (PF) 100 MCG/2ML IJ SOLN
INTRAMUSCULAR | Status: AC
  Filled 2018-02-22: qty 2

## 2018-02-22 MED ORDER — ROCURONIUM BROMIDE 100 MG/10ML IV SOLN
INTRAVENOUS | Status: DC | PRN
  Administered 2018-02-22 (×2): 10 mg via INTRAVENOUS
  Administered 2018-02-22: 50 mg via INTRAVENOUS

## 2018-02-22 MED ORDER — GLYCOPYRROLATE PF 0.2 MG/ML IJ SOSY
PREFILLED_SYRINGE | INTRAMUSCULAR | Status: AC
  Filled 2018-02-22: qty 1

## 2018-02-22 MED ORDER — SCOPOLAMINE 1 MG/3DAYS TD PT72
MEDICATED_PATCH | TRANSDERMAL | Status: AC
  Filled 2018-02-22: qty 1

## 2018-02-22 MED ORDER — KETOROLAC TROMETHAMINE 30 MG/ML IJ SOLN
30.0000 mg | Freq: Three times a day (TID) | INTRAMUSCULAR | Status: AC
  Administered 2018-02-22 – 2018-02-23 (×3): 30 mg via INTRAVENOUS
  Filled 2018-02-22: qty 1

## 2018-02-22 MED ORDER — LACTATED RINGERS IV SOLN
INTRAVENOUS | Status: DC
  Administered 2018-02-22 (×2): via INTRAVENOUS
  Administered 2018-02-22: 1000 mL via INTRAVENOUS
  Filled 2018-02-22: qty 1000

## 2018-02-22 MED ORDER — SUGAMMADEX SODIUM 200 MG/2ML IV SOLN
INTRAVENOUS | Status: DC | PRN
  Administered 2018-02-22: 200 mg via INTRAVENOUS

## 2018-02-22 MED ORDER — MIDAZOLAM HCL 5 MG/5ML IJ SOLN
INTRAMUSCULAR | Status: DC | PRN
  Administered 2018-02-22: 2 mg via INTRAVENOUS

## 2018-02-22 MED ORDER — ROCURONIUM BROMIDE 10 MG/ML (PF) SYRINGE
PREFILLED_SYRINGE | INTRAVENOUS | Status: AC
  Filled 2018-02-22: qty 5

## 2018-02-22 MED ORDER — OXYCODONE-ACETAMINOPHEN 5-325 MG PO TABS
ORAL_TABLET | ORAL | Status: AC
  Filled 2018-02-22: qty 1

## 2018-02-22 MED ORDER — OXYCODONE-ACETAMINOPHEN 5-325 MG PO TABS
ORAL_TABLET | ORAL | Status: AC
  Filled 2018-02-22: qty 2

## 2018-02-22 MED ORDER — SODIUM CHLORIDE 0.9 % IV SOLN
INTRAVENOUS | Status: AC
  Filled 2018-02-22: qty 2

## 2018-02-22 MED ORDER — LIDOCAINE 2% (20 MG/ML) 5 ML SYRINGE
INTRAMUSCULAR | Status: AC
  Filled 2018-02-22: qty 5

## 2018-02-22 MED ORDER — FENTANYL CITRATE (PF) 100 MCG/2ML IJ SOLN
INTRAMUSCULAR | Status: DC | PRN
  Administered 2018-02-22: 50 ug via INTRAVENOUS
  Administered 2018-02-22: 25 ug via INTRAVENOUS
  Administered 2018-02-22: 100 ug via INTRAVENOUS
  Administered 2018-02-22: 25 ug via INTRAVENOUS

## 2018-02-22 MED ORDER — SCOPOLAMINE 1 MG/3DAYS TD PT72
MEDICATED_PATCH | TRANSDERMAL | Status: DC | PRN
  Administered 2018-02-22: 1 via TRANSDERMAL

## 2018-02-22 MED ORDER — HYDROMORPHONE HCL 1 MG/ML IJ SOLN
0.2500 mg | INTRAMUSCULAR | Status: DC | PRN
  Administered 2018-02-22: 0.5 mg via INTRAVENOUS
  Administered 2018-02-22: 0.25 mg via INTRAVENOUS
  Administered 2018-02-22 (×2): 0.5 mg via INTRAVENOUS
  Administered 2018-02-22: 0.25 mg via INTRAVENOUS
  Filled 2018-02-22: qty 0.5

## 2018-02-22 MED ORDER — MIDAZOLAM HCL 2 MG/2ML IJ SOLN
INTRAMUSCULAR | Status: AC
  Filled 2018-02-22: qty 2

## 2018-02-22 MED ORDER — PROMETHAZINE HCL 25 MG/ML IJ SOLN
6.2500 mg | Freq: Three times a day (TID) | INTRAMUSCULAR | Status: DC | PRN
  Administered 2018-02-22 – 2018-02-23 (×2): 6.25 mg via INTRAVENOUS
  Filled 2018-02-22: qty 1

## 2018-02-22 MED ORDER — OXYCODONE HCL 5 MG/5ML PO SOLN
5.0000 mg | Freq: Once | ORAL | Status: DC | PRN
  Filled 2018-02-22: qty 5

## 2018-02-22 MED ORDER — MENTHOL 3 MG MT LOZG
1.0000 | LOZENGE | OROMUCOSAL | Status: DC | PRN
  Filled 2018-02-22: qty 9

## 2018-02-22 MED ORDER — ACETAMINOPHEN 500 MG PO TABS
1000.0000 mg | ORAL_TABLET | Freq: Once | ORAL | Status: AC
  Administered 2018-02-22: 1000 mg via ORAL
  Filled 2018-02-22: qty 2

## 2018-02-22 MED ORDER — HYDROMORPHONE HCL 1 MG/ML IJ SOLN
1.0000 mg | INTRAMUSCULAR | Status: DC | PRN
  Administered 2018-02-22: 0.5 mg via INTRAVENOUS
  Filled 2018-02-22: qty 1

## 2018-02-22 MED ORDER — SODIUM CHLORIDE 0.9 % IV SOLN
2.0000 g | INTRAVENOUS | Status: AC
  Administered 2018-02-22: 2 g via INTRAVENOUS
  Filled 2018-02-22: qty 2

## 2018-02-22 MED ORDER — PROPOFOL 10 MG/ML IV BOLUS
INTRAVENOUS | Status: AC
  Filled 2018-02-22: qty 40

## 2018-02-22 MED ORDER — LIDOCAINE HCL (CARDIAC) PF 100 MG/5ML IV SOSY
PREFILLED_SYRINGE | INTRAVENOUS | Status: DC | PRN
  Administered 2018-02-22: 100 mg via INTRAVENOUS

## 2018-02-22 MED ORDER — ONDANSETRON HCL 4 MG/2ML IJ SOLN
4.0000 mg | Freq: Four times a day (QID) | INTRAMUSCULAR | Status: DC | PRN
  Administered 2018-02-22 (×2): 4 mg via INTRAVENOUS
  Filled 2018-02-22: qty 2

## 2018-02-22 MED ORDER — DEXAMETHASONE SODIUM PHOSPHATE 10 MG/ML IJ SOLN
INTRAMUSCULAR | Status: AC
  Filled 2018-02-22: qty 1

## 2018-02-22 MED ORDER — DEXAMETHASONE SODIUM PHOSPHATE 4 MG/ML IJ SOLN
INTRAMUSCULAR | Status: DC | PRN
  Administered 2018-02-22: 10 mg via INTRAVENOUS

## 2018-02-22 MED ORDER — OXYCODONE HCL 5 MG PO TABS
5.0000 mg | ORAL_TABLET | Freq: Once | ORAL | Status: DC | PRN
  Filled 2018-02-22: qty 1

## 2018-02-22 MED ORDER — LACTATED RINGERS IV SOLN
INTRAVENOUS | Status: DC
  Filled 2018-02-22: qty 1000

## 2018-02-22 MED ORDER — OXYCODONE-ACETAMINOPHEN 5-325 MG PO TABS
1.0000 | ORAL_TABLET | ORAL | Status: DC | PRN
  Administered 2018-02-22 – 2018-02-23 (×4): 1 via ORAL
  Filled 2018-02-22: qty 2

## 2018-02-22 MED ORDER — ONDANSETRON HCL 4 MG/2ML IJ SOLN
INTRAMUSCULAR | Status: DC | PRN
  Administered 2018-02-22: 4 mg via INTRAVENOUS

## 2018-02-22 SURGICAL SUPPLY — 60 items
BAG RETRIEVAL 10 (BASKET)
BARRIER ADHS 3X4 INTERCEED (GAUZE/BANDAGES/DRESSINGS) IMPLANT
CANISTER SUCT 3000ML PPV (MISCELLANEOUS) ×2 IMPLANT
CATH FOLEY 2WAY  5CC 16FR SIL (CATHETERS) ×1
CATH FOLEY 2WAY 5CC 16FR SIL (CATHETERS) ×1 IMPLANT
CHLORAPREP W/TINT 26ML (MISCELLANEOUS) ×2 IMPLANT
COVER BACK TABLE 60X90IN (DRAPES) ×2 IMPLANT
DERMABOND ADVANCED (GAUZE/BANDAGES/DRESSINGS) ×1
DERMABOND ADVANCED .7 DNX12 (GAUZE/BANDAGES/DRESSINGS) ×1 IMPLANT
DRSG COVADERM PLUS 2X2 (GAUZE/BANDAGES/DRESSINGS) ×2 IMPLANT
DRSG OPSITE POSTOP 3X4 (GAUZE/BANDAGES/DRESSINGS) ×2 IMPLANT
DRSG TEGADERM 2-3/8X2-3/4 SM (GAUZE/BANDAGES/DRESSINGS) IMPLANT
ELECT LIGASURE LONG (ELECTRODE) IMPLANT
ELECT LIGASURE SHORT 9 REUSE (ELECTRODE) IMPLANT
ELECT REM PT RETURN 9FT ADLT (ELECTROSURGICAL) ×2
ELECTRODE REM PT RTRN 9FT ADLT (ELECTROSURGICAL) ×1 IMPLANT
GLOVE BIOGEL PI IND STRL 6.5 (GLOVE) ×1 IMPLANT
GLOVE BIOGEL PI IND STRL 7.0 (GLOVE) ×1 IMPLANT
GLOVE BIOGEL PI IND STRL 7.5 (GLOVE) ×1 IMPLANT
GLOVE BIOGEL PI INDICATOR 6.5 (GLOVE) ×1
GLOVE BIOGEL PI INDICATOR 7.0 (GLOVE) ×1
GLOVE BIOGEL PI INDICATOR 7.5 (GLOVE) ×1
GLOVE ECLIPSE 6.5 STRL STRAW (GLOVE) ×2 IMPLANT
GLOVE INDICATOR 7.0 STRL GRN (GLOVE) ×2 IMPLANT
GLOVE INDICATOR 7.5 STRL GRN (GLOVE) ×2 IMPLANT
GOWN STRL REUS W/TWL LRG LVL3 (GOWN DISPOSABLE) ×4 IMPLANT
GOWN STRL REUS W/TWL XL LVL3 (GOWN DISPOSABLE) ×2 IMPLANT
HOLDER FOLEY CATH W/STRAP (MISCELLANEOUS) ×2 IMPLANT
KIT TURNOVER CYSTO (KITS) ×2 IMPLANT
NS IRRIG 500ML POUR BTL (IV SOLUTION) ×2 IMPLANT
PACK LAVH (CUSTOM PROCEDURE TRAY) ×2 IMPLANT
PACK ROBOTIC GOWN (GOWN DISPOSABLE) ×2 IMPLANT
PACK TRENDGUARD 450 HYBRID PRO (MISCELLANEOUS) ×1 IMPLANT
PAD OB MATERNITY 4.3X12.25 (PERSONAL CARE ITEMS) ×2 IMPLANT
PAD PREP 24X48 CUFFED NSTRL (MISCELLANEOUS) ×2 IMPLANT
SCISSORS LAP 5X35 DISP (ENDOMECHANICALS) IMPLANT
SEALER TISSUE G2 CVD JAW 45CM (ENDOMECHANICALS) ×2 IMPLANT
SET IRRIG TUBING LAPAROSCOPIC (IRRIGATION / IRRIGATOR) ×2 IMPLANT
STRIP CLOSURE SKIN 1/4X4 (GAUZE/BANDAGES/DRESSINGS) IMPLANT
SUT MNCRL 0 MO-4 VIOLET 18 CR (SUTURE) ×2 IMPLANT
SUT MNCRL 0 VIOLET 6X18 (SUTURE) ×1 IMPLANT
SUT MON AB 2-0 CT1 36 (SUTURE) ×2 IMPLANT
SUT MONOCRYL 0 6X18 (SUTURE) ×1
SUT MONOCRYL 0 MO 4 18  CR/8 (SUTURE) ×2
SUT VIC AB 3-0 PS2 18 (SUTURE) ×1
SUT VIC AB 3-0 PS2 18XBRD (SUTURE) ×1 IMPLANT
SUT VIC AB 3-0 SH 27 (SUTURE)
SUT VIC AB 3-0 SH 27X BRD (SUTURE) IMPLANT
SUT VICRYL 0 UR6 27IN ABS (SUTURE) ×2 IMPLANT
SYR 3ML 23GX1 SAFETY (SYRINGE) IMPLANT
SYR BULB IRRIGATION 50ML (SYRINGE) ×2 IMPLANT
SYS BAG RETRIEVAL 10MM (BASKET)
SYSTEM BAG RETRIEVAL 10MM (BASKET) IMPLANT
TOWEL OR 17X24 6PK STRL BLUE (TOWEL DISPOSABLE) ×4 IMPLANT
TRENDGUARD 450 HYBRID PRO PACK (MISCELLANEOUS) ×2
TROCAR OPTI TIP 5M 100M (ENDOMECHANICALS) ×2 IMPLANT
TROCAR XCEL NON-BLD 11X100MML (ENDOMECHANICALS) ×2 IMPLANT
TUBING INSUF HEATED (TUBING) ×2 IMPLANT
WARMER LAPAROSCOPE (MISCELLANEOUS) ×2 IMPLANT
WATER STERILE IRR 500ML POUR (IV SOLUTION) ×2 IMPLANT

## 2018-02-22 NOTE — Progress Notes (Signed)
Day of Surgery Procedure(s) (LRB): LAPAROSCOPIC ASSISTED VAGINAL HYSTERECTOMY WITH SALPINGECTOMY (Bilateral)  Subjective: Patient reports nausea, incisional pain and tolerating PO.    Objective: I have reviewed patient's vital signs, intake and output and medications.  General: alert and cooperative GI: normal findings: soft, non-tender  Assessment: s/p Procedure(s): LAPAROSCOPIC ASSISTED VAGINAL HYSTERECTOMY WITH SALPINGECTOMY (Bilateral): stable  Plan: Advance diet Encourage ambulation Advance to PO medication  LOS: 0 days    Lori Hicks 02/22/2018, 1:13 PM

## 2018-02-22 NOTE — Progress Notes (Signed)
02/22/2018 2000 Pt. Noted to have fever of 101.5 orally. Other vital signs stable. Incisions WNL. Pt. C/o nausea and pain. Dr. Julien Girt paged and made aware. Verbal order received to administer Tylenol 1000 mg PO x 1. Orders enacted. Will continue to closely monitor patient.  Ruba Outen, Arville Lime

## 2018-02-22 NOTE — Anesthesia Preprocedure Evaluation (Signed)
Anesthesia Evaluation  Patient identified by MRN, date of birth, ID band Patient awake    Reviewed: Allergy & Precautions, NPO status , Patient's Chart, lab work & pertinent test results  History of Anesthesia Complications (+) PONV  Airway Mallampati: II  TM Distance: >3 FB Neck ROM: Full    Dental no notable dental hx.    Pulmonary neg pulmonary ROS, asthma ,    Pulmonary exam normal breath sounds clear to auscultation       Cardiovascular hypertension, negative cardio ROS Normal cardiovascular exam Rhythm:Regular Rate:Normal     Neuro/Psych  Headaches, negative neurological ROS  negative psych ROS   GI/Hepatic negative GI ROS, Neg liver ROS,   Endo/Other  negative endocrine ROS  Renal/GU negative Renal ROS  negative genitourinary   Musculoskeletal negative musculoskeletal ROS (+)   Abdominal (+) + obese,   Peds negative pediatric ROS (+)  Hematology negative hematology ROS (+)   Anesthesia Other Findings   Reproductive/Obstetrics negative OB ROS                             Anesthesia Physical Anesthesia Plan  ASA: II  Anesthesia Plan: General   Post-op Pain Management:    Induction: Intravenous  PONV Risk Score and Plan: 4 or greater and Ondansetron, Dexamethasone, Midazolam, Scopolamine patch - Pre-op and Treatment may vary due to age or medical condition  Airway Management Planned: Oral ETT  Additional Equipment:   Intra-op Plan:   Post-operative Plan: Extubation in OR  Informed Consent: I have reviewed the patients History and Physical, chart, labs and discussed the procedure including the risks, benefits and alternatives for the proposed anesthesia with the patient or authorized representative who has indicated his/her understanding and acceptance.   Dental advisory given  Plan Discussed with: CRNA  Anesthesia Plan Comments:         Anesthesia Quick  Evaluation

## 2018-02-22 NOTE — Anesthesia Postprocedure Evaluation (Signed)
Anesthesia Post Note  Patient: Lori Hicks  Procedure(s) Performed: LAPAROSCOPIC ASSISTED VAGINAL HYSTERECTOMY WITH SALPINGECTOMY (Bilateral Abdomen)     Patient location during evaluation: PACU Anesthesia Type: General Level of consciousness: awake and alert Pain management: pain level controlled Vital Signs Assessment: post-procedure vital signs reviewed and stable Respiratory status: spontaneous breathing, nonlabored ventilation and respiratory function stable Cardiovascular status: blood pressure returned to baseline and stable Postop Assessment: no apparent nausea or vomiting Anesthetic complications: no    Last Vitals:  Vitals:   02/22/18 1000 02/22/18 1015  BP: 125/66 122/71  Pulse: 72 75  Resp: 20 18  Temp:    SpO2: 95% 97%    Last Pain:  Vitals:   02/22/18 0948  TempSrc:   PainSc: 10-Worst pain ever                 Lynda Rainwater

## 2018-02-22 NOTE — Op Note (Signed)
02/22/2018  9:15 AM  PATIENT:  Lori Hicks  42 y.o. female  PRE-OPERATIVE DIAGNOSIS:  menorrhagia, dysmenorrhea, right lower quandrant pain  POST-OPERATIVE DIAGNOSIS:  menorrhagia, dysmenorrhea, right lower quandrant pain  PROCEDURE:  Procedure(s): LAPAROSCOPIC ASSISTED VAGINAL HYSTERECTOMY WITH SALPINGECTOMY (Bilateral)  SURGEON:  Surgeon(s) and Role:    * Marylynn Pearson, MD - Primary    * Arvella Nigh, MD - Assisting  ASSISTANTS: Hope Budds, MD   ANESTHESIA:   local and general  EBL:  125 mL   BLOOD ADMINISTERED:none  DRAINS: foley   LOCAL MEDICATIONS USED:  MARCAINE     SPECIMEN:  Source of Specimen:  uterus and bilateral fallopian tubes  DISPOSITION OF SPECIMEN:  PATHOLOGY  COUNTS:  YES  TOURNIQUET:  * No tourniquets in log *  DICTATION: .Other Dictation: Dictation Number pending  PLAN OF CARE: Admit for overnight observation  PATIENT DISPOSITION:  PACU - hemodynamically stable.   Delay start of Pharmacological VTE agent (>24hrs) due to surgical blood loss or risk of bleeding: no

## 2018-02-22 NOTE — Progress Notes (Signed)
02/22/2018 1840 Pt. With continued nausea and vomiting after prn Zofran administration. Dr. Gaetano Net paged and made aware. Verbal order received Phenergan 6.25 mg IV Q8h prn. Order placed and enacted. Will continue to closely monitor patient.  Lori Hicks, Arville Lime

## 2018-02-22 NOTE — Transfer of Care (Signed)
Immediate Anesthesia Transfer of Care Note  Patient: Lori Hicks  Procedure(s) Performed: Procedure(s) (LRB): LAPAROSCOPIC ASSISTED VAGINAL HYSTERECTOMY WITH SALPINGECTOMY (Bilateral)  Patient Location: PACU  Anesthesia Type: General  Level of Consciousness: awake, sedated, patient cooperative and responds to stimulation  Airway & Oxygen Therapy: Patient Spontanous Breathing and Patient connected to NCO2  Post-op Assessment: Report given to PACU RN, Post -op Vital signs reviewed and stable and Patient moving all extremities  Post vital signs: Reviewed and stable  Complications: No apparent anesthesia complications

## 2018-02-22 NOTE — Anesthesia Procedure Notes (Signed)
Procedure Name: Intubation Date/Time: 02/22/2018 7:42 AM Performed by: Justice Rocher, CRNA Pre-anesthesia Checklist: Patient identified, Emergency Drugs available, Suction available and Patient being monitored Patient Re-evaluated:Patient Re-evaluated prior to induction Oxygen Delivery Method: Circle system utilized Preoxygenation: Pre-oxygenation with 100% oxygen Induction Type: IV induction Ventilation: Mask ventilation without difficulty Laryngoscope Size: Mac and 3 Grade View: Grade II Tube type: Oral Tube size: 7.0 mm Number of attempts: 1 Airway Equipment and Method: Stylet and Oral airway Placement Confirmation: ETT inserted through vocal cords under direct vision,  positive ETCO2 and breath sounds checked- equal and bilateral Secured at: 21 cm Tube secured with: Tape Dental Injury: Teeth and Oropharynx as per pre-operative assessment

## 2018-02-23 DIAGNOSIS — N920 Excessive and frequent menstruation with regular cycle: Secondary | ICD-10-CM | POA: Diagnosis not present

## 2018-02-23 LAB — POCT HEMOGLOBIN-HEMACUE: Hemoglobin: 11 g/dL — ABNORMAL LOW (ref 12.0–15.0)

## 2018-02-23 MED ORDER — OXYCODONE-ACETAMINOPHEN 5-325 MG PO TABS: 1.0000 | ORAL_TABLET | ORAL | 0 refills | Status: DC | PRN

## 2018-02-23 MED ORDER — PROMETHAZINE HCL 12.5 MG PO TABS: 12.5000 mg | ORAL_TABLET | Freq: Four times a day (QID) | ORAL | 0 refills | Status: DC | PRN

## 2018-02-23 MED ORDER — KETOROLAC TROMETHAMINE 30 MG/ML IJ SOLN
INTRAMUSCULAR | Status: AC
  Filled 2018-02-23: qty 1

## 2018-02-23 MED ORDER — PROMETHAZINE HCL 25 MG/ML IJ SOLN
INTRAMUSCULAR | Status: AC
  Filled 2018-02-23: qty 1

## 2018-02-23 MED ORDER — OXYCODONE-ACETAMINOPHEN 5-325 MG PO TABS
ORAL_TABLET | ORAL | Status: AC
  Filled 2018-02-23: qty 1

## 2018-02-23 MED ORDER — IBUPROFEN 600 MG PO TABS: 600.0000 mg | ORAL_TABLET | Freq: Four times a day (QID) | ORAL | 0 refills | Status: DC | PRN

## 2018-02-23 NOTE — Progress Notes (Signed)
Discharge instruction given to pt.  Pt and husband voiced understanding of wound care, ADLs, and postop meds.  Prescriptions with pt.  Pt was voiding, tolerating po fluids well and VSS at discharge.

## 2018-02-23 NOTE — Discharge Summary (Signed)
Physician Discharge Summary  Patient ID: BLOSSOM CRUME MRN: [id number] DOB/AGE: 1975/12/13 42 y.o.  Admit date: 02/22/2018 Discharge date: 02/23/2018  Admission Diagnoses:  Pelvic pain  Discharge Diagnoses:  Active Problems:   Pelvic pain   Discharged Condition: good  Hospital Course: Pt was admitted for post op care following hysterectomy.  Pain initially controlled with IV medications.  Once patient tolerating PO intake, pain controlled with PO medications.  She was able to ambulate and void without difficulty.  Mild nausea controlled with PO antiemetics.  Consults: None  Significant Diagnostic Studies: labs: Hgb   Treatments: surgery: LAVH/BS  Discharge Exam: Blood pressure 111/70, pulse 69, temperature 98.8 F (37.1 C), resp. rate 16, height 5\' 3"  (1.6 m), weight 224 lb 9.6 oz (101.9 kg), last menstrual period 01/31/2018, SpO2 97 %. General appearance: alert and cooperative GI: normal findings: soft, non-tender Incision/Wound: c/d/i  Disposition:    Discharge home   Signed: Marylynn Pearson 02/23/2018, 7:24 AM

## 2018-02-23 NOTE — Discharge Instructions (Signed)
No NSAID's (ibuprofen, naproxen, aspirin) until 12:45pm today

## 2018-02-23 NOTE — Progress Notes (Signed)
1 Day Post-Op Procedure(s) (LRB): LAPAROSCOPIC ASSISTED VAGINAL HYSTERECTOMY WITH SALPINGECTOMY (Bilateral)  Subjective: Patient reports nausea, incisional pain, tolerating PO and no problems voiding.    Objective: I have reviewed patient's vital signs, intake and output, medications and labs.  General: alert and cooperative GI: normal findings: soft, non-tender Vaginal Bleeding: minimal   Hgb 11.0  Assessment: s/p Procedure(s): LAPAROSCOPIC ASSISTED VAGINAL HYSTERECTOMY WITH SALPINGECTOMY (Bilateral): stable and progressing well  Plan: Advance diet Encourage ambulation Advance to PO medication Discharge home  LOS: 0 days    Marylynn Pearson 02/23/2018, 7:22 AM

## 2018-02-23 NOTE — Op Note (Signed)
Lori Hicks, Lori Hicks MEDICAL RECORD [id number] ACCOUNT [id number] DATE OF BIRTH:26-Mar-1976 FACILITY: [facility] LOCATION: [facility] PHYSICIAN:Sondos Wolfman, MD  OPERATIVE REPORT  DATE OF PROCEDURE:  02/22/2018  PREOPERATIVE DIAGNOSIS:  Pelvic pain.  POSTOPERATIVE DIAGNOSES:  Pelvic pain.  PROCEDURE:  Laparoscopic assisted vaginal hysterectomy with bilateral salpingectomy.  SURGEON:  Marylynn Pearson, MD  ASSISTANT:  Arvella Nigh, MD  ESTIMATED BLOOD LOSS:  125 mL.  ANESTHESIA:  General.  COMPLICATIONS:  None.  CONDITION:  Stable to recovery room.  DESCRIPTION OF PROCEDURE:  The patient was taken to the operating room after informed consent was obtained.  She was placed in the dorsal lithotomy position using Allen stirrups, prepped and draped in sterile fashion and a Foley catheter was inserted.   Bivalve speculum was placed in the vagina and Hulka clamp was placed on the cervix.  Speculum was removed and our attention was turned to the abdomen.  An infraumbilical skin incision was made with the scalpel and extended bluntly to the level of the  fascia.  Optical trocar was inserted under direct visualization.  Once intraperitoneal placement was confirmed, CO2 was turned on and the abdomen and pelvis were insufflated.  A survey was performed.  The uterus, bilateral tubes and ovaries appeared  normal.  No evidence of Essure perforation was seen.  A 5 mm suprapubic incision was made.  A 5 mm trocar was inserted under direct visualization.  The uterus was manipulated to the patient's left side and the right adnexa was grasped, tented upwards and  toward midline.  The ENSEAL was used to clamp, cauterize and cut the mesosalpinx.  This was extended to the uterus.  The broad ligament was then clamped, cauterized, and cut.  The uteroovarian ligament was then clamped, cauterized, and cut.  Round  ligament was clamped, cauterized, and cut.  Excellent hemostasis was noted and this procedure was  repeated on the opposite side.  Hemostasis was noted bilaterally.  Instruments were removed from the trocar and our attention was turned to the vagina.  Hulka clamp was removed and a circumferential incision was made around the cervix.  Posterior cul-de-sac was entered sharply using curved Mayo scissors and a long weighted speculum in the posterior cul-de-sac.  Bilateral uterosacral ligaments were  clamped, cut and suture ligated.  Anterior cul-de-sac was then entered sharply and a Deaver was placed anteriorly.  Cardinal ligaments and uterine arteries were then clamped bilaterally, cut and suture ligated.  Hemostasis was noted.  The fundus of the  uterus was grasped and delivered through the posterior cul-de-sac.  Remaining pedicles were clamped, cut and suture ligated.  The posterior vaginal cuff was then reapproximated and run with a running locked suture of Monocryl and then the cuff was closed  using a series of figure-of-eight Monocryl sutures.  Hemostasis was noted and our attention was returned to the abdomen.  The abdomen was reinsufflated and the camera was reinserted.  Survey was performed.  All pedicles of the vaginal cuff were noted to be hemostatic.  All instruments and trocars were then removed from the abdomen.  A deep stitch was placed in the  infraumbilical skin incision and the skin was closed with Vicryl.  Bandages were placed.  The patient was extubated and taken to the recovery room in stable condition.  Sponge, lap, needle and instruments counts were correct x2.  TN/NUANCE  D:02/22/2018 T:02/22/2018 JOB:[id number]

## 2018-02-24 ENCOUNTER — Encounter (HOSPITAL_BASED_OUTPATIENT_CLINIC_OR_DEPARTMENT_OTHER): Payer: Self-pay | Admitting: Obstetrics and Gynecology

## 2019-08-09 ENCOUNTER — Other Ambulatory Visit: Payer: Self-pay

## 2019-08-09 DIAGNOSIS — Z20822 Contact with and (suspected) exposure to covid-19: Secondary | ICD-10-CM

## 2019-08-11 LAB — NOVEL CORONAVIRUS, NAA: SARS-CoV-2, NAA: NOT DETECTED

## 2019-09-15 ENCOUNTER — Other Ambulatory Visit: Payer: Self-pay

## 2019-09-15 ENCOUNTER — Emergency Department (HOSPITAL_BASED_OUTPATIENT_CLINIC_OR_DEPARTMENT_OTHER)
Admission: EM | Admit: 2019-09-15 | Discharge: 2019-09-15 | Disposition: A | Discharge status: Home / Self Care | Payer: No Typology Code available for payment source | Attending: Emergency Medicine | Admitting: Emergency Medicine

## 2019-09-15 ENCOUNTER — Emergency Department (HOSPITAL_BASED_OUTPATIENT_CLINIC_OR_DEPARTMENT_OTHER): Payer: No Typology Code available for payment source

## 2019-09-15 ENCOUNTER — Encounter (HOSPITAL_BASED_OUTPATIENT_CLINIC_OR_DEPARTMENT_OTHER): Payer: Self-pay | Admitting: Emergency Medicine

## 2019-09-15 DIAGNOSIS — N201 Calculus of ureter: Secondary | ICD-10-CM | POA: Diagnosis not present

## 2019-09-15 DIAGNOSIS — Z91040 Latex allergy status: Secondary | ICD-10-CM | POA: Diagnosis not present

## 2019-09-15 DIAGNOSIS — Z79899 Other long term (current) drug therapy: Secondary | ICD-10-CM | POA: Diagnosis not present

## 2019-09-15 DIAGNOSIS — R1031 Right lower quadrant pain: Secondary | ICD-10-CM | POA: Diagnosis present

## 2019-09-15 LAB — URINALYSIS, ROUTINE W REFLEX MICROSCOPIC
Bilirubin Urine: NEGATIVE
Glucose, UA: NEGATIVE mg/dL
Ketones, ur: NEGATIVE mg/dL
Leukocytes,Ua: NEGATIVE
Nitrite: NEGATIVE
Protein, ur: 30 mg/dL — AB
Specific Gravity, Urine: >1.03 — ABNORMAL HIGH (ref 1.005–1.030)
pH: 5 (ref 5.0–8.0)

## 2019-09-15 LAB — COMPREHENSIVE METABOLIC PANEL
ALT: 37 U/L (ref 0–44)
AST: 22 U/L (ref 15–41)
Albumin: 3.7 g/dL (ref 3.5–5.0)
Alkaline Phosphatase: 58 U/L (ref 38–126)
Anion gap: 9 (ref 5–15)
BUN: 24 mg/dL — ABNORMAL HIGH (ref 6–20)
CO2: 23 mmol/L (ref 22–32)
Calcium: 8.7 mg/dL — ABNORMAL LOW (ref 8.9–10.3)
Chloride: 108 mmol/L (ref 98–111)
Creatinine, Ser: 0.94 mg/dL (ref 0.44–1.00)
GFR calc Af Amer: >60 mL/min (ref >60)
GFR calc non Af Amer: >60 mL/min (ref >60)
Glucose, Bld: 150 mg/dL — ABNORMAL HIGH (ref 70–99)
Potassium: 3.7 mmol/L (ref 3.5–5.1)
Sodium: 140 mmol/L (ref 135–145)
Total Bilirubin: 1.1 mg/dL (ref 0.3–1.2)
Total Protein: 6.7 g/dL (ref 6.5–8.1)

## 2019-09-15 LAB — CBC
HCT: 40.4 % (ref 36.0–46.0)
Hemoglobin: 13.4 g/dL (ref 12.0–15.0)
MCH: 31 pg (ref 26.0–34.0)
MCHC: 33.2 g/dL (ref 30.0–36.0)
MCV: 93.5 fL (ref 80.0–100.0)
Platelets: 142 10*3/uL — ABNORMAL LOW (ref 150–400)
RBC: 4.32 MIL/uL (ref 3.87–5.11)
RDW: 12.7 % (ref 11.5–15.5)
WBC: 10.9 10*3/uL — ABNORMAL HIGH (ref 4.0–10.5)
nRBC: 0 % (ref 0.0–0.2)

## 2019-09-15 LAB — URINALYSIS, MICROSCOPIC (REFLEX): RBC / HPF: >50 RBC/hpf (ref 0–5)

## 2019-09-15 MED ORDER — SODIUM CHLORIDE 0.9 % IV BOLUS
500.0000 mL | Freq: Once | INTRAVENOUS | Status: AC
  Administered 2019-09-15: 500 mL via INTRAVENOUS

## 2019-09-15 MED ORDER — ONDANSETRON HCL 4 MG/2ML IJ SOLN
4.0000 mg | Freq: Once | INTRAMUSCULAR | Status: AC
  Administered 2019-09-15: 4 mg via INTRAVENOUS
  Filled 2019-09-15: qty 2

## 2019-09-15 MED ORDER — SODIUM CHLORIDE 0.9 % IV SOLN: INTRAVENOUS | Status: DC

## 2019-09-15 MED ORDER — NAPROXEN 500 MG PO TABS: 500.0000 mg | ORAL_TABLET | Freq: Two times a day (BID) | ORAL | 0 refills | Status: DC

## 2019-09-15 MED ORDER — KETOROLAC TROMETHAMINE 30 MG/ML IJ SOLN
30.0000 mg | Freq: Once | INTRAMUSCULAR | Status: AC
  Administered 2019-09-15: 30 mg via INTRAVENOUS
  Filled 2019-09-15: qty 1

## 2019-09-15 MED ORDER — HYDROMORPHONE HCL 1 MG/ML IJ SOLN
1.0000 mg | Freq: Once | INTRAMUSCULAR | Status: AC
  Administered 2019-09-15: 1 mg via INTRAVENOUS
  Filled 2019-09-15: qty 1

## 2019-09-15 MED ORDER — HYDROCODONE-ACETAMINOPHEN 5-325 MG PO TABS: 1.0000 | ORAL_TABLET | Freq: Four times a day (QID) | ORAL | 0 refills | Status: DC | PRN

## 2019-09-15 MED ORDER — ONDANSETRON 4 MG PO TBDP: 4.0000 mg | ORAL_TABLET | Freq: Three times a day (TID) | ORAL | 1 refills | Status: AC | PRN

## 2019-09-15 NOTE — ED Notes (Signed)
Pt ambulatory to BR with steady gait.

## 2019-09-15 NOTE — ED Notes (Signed)
Pt returned from CT °

## 2019-09-15 NOTE — ED Notes (Signed)
ED Provider at bedside. 

## 2019-09-15 NOTE — ED Triage Notes (Signed)
Pt presents with RLQ pain x 3 hours. Pt also reports 1 episode of vomiting and diarrhea. Pt appears to be uncomfortable during triage. Pt denies fevers, urinary symptoms.

## 2019-09-15 NOTE — Discharge Instructions (Addendum)
CT scan showed a right-sided kidney stone.  Very close to passing into the bladder.  Take the Naprosyn as directed.  Supplement with the hydrocodone as needed for additional pain control.  Take Zofran for nausea and vomiting.  Return for fever any new or worse symptoms or persistent vomiting.  Call urology for follow-up.  Information provided above.

## 2019-09-15 NOTE — ED Notes (Signed)
Patient transported to CT 

## 2019-09-15 NOTE — ED Notes (Signed)
Pt getting dressed.

## 2019-09-15 NOTE — ED Provider Notes (Addendum)
[facility] EMERGENCY DEPARTMENT Provider Note   CSN: [id number] Arrival date & time: 09/15/19  [id number]     History Chief Complaint  Patient presents with  . Abdominal Pain    Lori Hicks is a 43 y.o. female.  Patient with acute onset of right lower quadrant abdominal pain at 4 this morning.'s been intense since it began.  Felt fine yesterday.  Feeling nauseated states vomited once.  And patient did report an episode of diarrhea.  Patient's had a hysterectomy.  Denies any fevers denies any urinary symptoms.        Past Medical History:  Diagnosis Date  . Allergy   . Asthma    CHILHOOD ASTHMA  . Back pain 1994   LOWER LUMBAR DISC PAIN   . Chronic fatigue AGE 85 0R 16   NOW RESOLVED, EPISTEIN BARR ALSO THEN  . Fibroids   . Headache(784.0)    from POT syndrome  . History of ITP AGE 88 MONTHS   TX DONE AS CHILD, NOT CURRENT PROBLEM PROBLEM  . PONV (postoperative nausea and vomiting)   . Postural orthostatic tachycardia syndrome   . Stroke [facility])    MRI revealed possibility. NO RESIDUAL DEFICITS NOT CONFIRMED STROKE  2005 WAS YEAR    Patient Active Problem List   Diagnosis Date Noted  . Pelvic pain 02/22/2018  . Admission for sterilization 04/28/2011    Class: Minor  . ECCHYMOSES 01/17/2009  . ESSENTIAL HYPERTENSION, BENIGN 01/07/2009  . DYSAUTONOMIA 01/07/2009  . UNSPECIFIED SLEEP DISTURBANCE 01/07/2009  . [place] ALLERGY 01/07/2009    Past Surgical History:  Procedure Laterality Date  . ABDOMINAL HYSTERECTOMY    . EAR TUBES AS CHILD    . LAPAROSCOPIC VAGINAL HYSTERECTOMY WITH SALPINGECTOMY Bilateral 02/22/2018   Procedure: LAPAROSCOPIC ASSISTED VAGINAL HYSTERECTOMY WITH SALPINGECTOMY;  Surgeon: Marylynn Pearson, MD;  Location: [facility];  Service: Gynecology;  Laterality: Bilateral;  . NASAL SINUS SURGERY  2000  . TONSILLECTOMY  AS CHILD   AND ADENOIDS   . TONSILLECTOMY AND ADENOIDECTOMY     as a child  . TORN MENISCUS REPAIR  AND PLICO BAND REMOVAL Right 2014  . TUBAL LIGATION  04/28/2011   Procedure: ESSURE TUBAL STERILIZATION;  Surgeon: Elveria Royals;  Location: [facility];  Service: Gynecology;  Laterality: N/A;  Cervical Block Only     OB History   No obstetric history on file.     Family History  Problem Relation Age of Onset  . Stroke Mother   . Diabetes Father   . Asthma Brother   . Cancer Maternal Grandfather   . Heart disease Paternal Grandfather     Social History   Tobacco Use  . Smoking status: Never Smoker  . Smokeless tobacco: Never Used  Substance Use Topics  . Alcohol use: Yes    Comment: less than once a week  . Drug use: No    Home Medications Prior to Admission medications   Medication Sig Start Date End Date Taking? Authorizing Provider  doxycycline (VIBRAMYCIN) 100 MG capsule Take 100 mg by mouth 2 (two) times daily. 09/13/19   [provider]  DULoxetine (CYMBALTA) 30 MG capsule Take 30 mg by mouth 2 (two) times daily. 07/06/19   [provider]  ibuprofen (ADVIL,MOTRIN) 600 MG tablet Take 1 tablet (600 mg total) by mouth every 6 (six) hours as needed for moderate pain. 02/23/18   Marylynn Pearson, MD  loratadine (CLARITIN) 10 MG tablet Take 10 mg by mouth daily  as needed for allergies.    [provider]  oxyCODONE-acetaminophen (PERCOCET/ROXICET) 5-325 MG tablet Take 1-2 tablets by mouth every 4 (four) hours as needed for severe pain. 02/23/18   Marylynn Pearson, MD  predniSONE (STERAPRED UNI-PAK 21 TAB) 10 MG (21) TBPK tablet  08/25/19   [provider]  promethazine (PHENERGAN) 12.5 MG tablet Take 1 tablet (12.5 mg total) by mouth every 6 (six) hours as needed for nausea or vomiting. 02/23/18   Marylynn Pearson, MD  SYNTHROID 75 MCG tablet Take 75 mcg by mouth every morning. 07/06/19   [provider]    Allergies    Shellfish-derived products, Erythromycin, Latex, and Sulfonamide derivatives  Review of Systems   Review of Systems    Constitutional: Negative for chills and fever.  HENT: Negative for congestion, rhinorrhea and sore throat.   Eyes: Negative for visual disturbance.  Respiratory: Negative for cough and shortness of breath.   Cardiovascular: Negative for chest pain and leg swelling.  Gastrointestinal: Positive for abdominal pain. Negative for diarrhea, nausea and vomiting.  Genitourinary: Positive for flank pain. Negative for dysuria and hematuria.  Musculoskeletal: Negative for back pain and neck pain.  Skin: Negative for rash.  Neurological: Negative for dizziness, light-headedness and headaches.  Hematological: Does not bruise/bleed easily.  Psychiatric/Behavioral: Negative for confusion.    Physical Exam Updated Vital Signs BP (!) 119/59   Pulse (!) 59   Temp (!) 97.5 F (36.4 C) (Oral)   Resp 17   Ht 1.6 m (5\' 3" )   Wt 110.7 kg   LMP 01/31/2018   SpO2 99%   BMI 43.22 kg/m   Physical Exam Vitals and nursing note reviewed.  Constitutional:      General: She is not in acute distress.    Appearance: Normal appearance. She is well-developed.  HENT:     Head: Normocephalic and atraumatic.  Eyes:     Conjunctiva/sclera: Conjunctivae normal.     Pupils: Pupils are equal, round, and reactive to light.  Cardiovascular:     Rate and Rhythm: Normal rate and regular rhythm.     Heart sounds: No murmur.  Pulmonary:     Effort: Pulmonary effort is normal. No respiratory distress.     Breath sounds: Normal breath sounds.  Abdominal:     Palpations: Abdomen is soft.     Tenderness: There is abdominal tenderness. There is no guarding.     Comments: Slight tenderness right lower quadrant.  Musculoskeletal:        General: Normal range of motion.     Cervical back: Normal range of motion and neck supple.  Skin:    General: Skin is warm and dry.  Neurological:     General: No focal deficit present.     Mental Status: She is alert and oriented to person, place, and time.     ED Results /  Procedures / Treatments   Labs (all labs ordered are listed, but only abnormal results are displayed) Labs Reviewed  COMPREHENSIVE METABOLIC PANEL - Abnormal; Notable for the following components:      Result Value   Glucose, Bld 150 (*)    BUN 24 (*)    Calcium 8.7 (*)    All other components within normal limits  CBC - Abnormal; Notable for the following components:   WBC 10.9 (*)    Platelets 142 (*)    All other components within normal limits  URINALYSIS, ROUTINE W REFLEX MICROSCOPIC - Abnormal; Notable for the following components:  APPearance CLOUDY (*)    Specific Gravity, Urine >1.030 (*)    Hgb urine dipstick LARGE (*)    Protein, ur 30 (*)    All other components within normal limits  URINALYSIS, MICROSCOPIC (REFLEX) - Abnormal; Notable for the following components:   Bacteria, UA MANY (*)    All other components within normal limits    EKG None  Radiology CT Renal Stone Study  Result Date: 09/15/2019 CLINICAL DATA:  Right lower quadrant pain 3 hours. Vomiting and diarrhea. EXAM: CT ABDOMEN AND PELVIS WITHOUT CONTRAST TECHNIQUE: Multidetector CT imaging of the abdomen and pelvis was performed following the standard protocol without IV contrast. COMPARISON:  None. FINDINGS: Lower chest: Lung bases are normal. Hepatobiliary: Normal. Pancreas: Normal. Spleen: Normal. Adrenals/Urinary Tract: Adrenal glands are normal. Kidneys are normal in size. Exophytic 1.2 cm cyst over the mid pole left kidney. Punctate nonobstructing stone over the mid pole right kidney. There is mild right-sided hydronephrosis. There is a 2-3 mm stone over the right UVJ causing this low-grade obstruction. Left ureter is normal. No left-sided renal stones. Stomach/Bowel: Stomach and small bowel are normal. Appendix is normal. Mild diverticulosis of the colon. Vascular/Lymphatic: Normal. Reproductive: Previous hysterectomy. Other: No free fluid or focal inflammatory change. Musculoskeletal: Unremarkable.  IMPRESSION: 1. Mild right-sided nephrolithiasis. 2-3 mm stone at the right UVJ causing low-grade obstruction. 2.  Mild colonic diverticulosis. Electronically Signed   By: Marin Olp M.D.   On: 09/15/2019 08:14     Results for orders placed or performed during the hospital encounter of 09/15/19  Comprehensive metabolic panel  Result Value Ref Range   Sodium 140 135 - 145 mmol/L   Potassium 3.7 3.5 - 5.1 mmol/L   Chloride 108 98 - 111 mmol/L   CO2 23 22 - 32 mmol/L   Glucose, Bld 150 (H) 70 - 99 mg/dL   BUN 24 (H) 6 - 20 mg/dL   Creatinine, Ser 0.94 0.44 - 1.00 mg/dL   Calcium 8.7 (L) 8.9 - 10.3 mg/dL   Total Protein 6.7 6.5 - 8.1 g/dL   Albumin 3.7 3.5 - 5.0 g/dL   AST 22 15 - 41 U/L   ALT 37 0 - 44 U/L   Alkaline Phosphatase 58 38 - 126 U/L   Total Bilirubin 1.1 0.3 - 1.2 mg/dL   GFR calc non Af Amer >60 >60 mL/min   GFR calc Af Amer >60 >60 mL/min   Anion gap 9 5 - 15  CBC  Result Value Ref Range   WBC 10.9 (H) 4.0 - 10.5 K/uL   RBC 4.32 3.87 - 5.11 MIL/uL   Hemoglobin 13.4 12.0 - 15.0 g/dL   HCT 40.4 36.0 - 46.0 %   MCV 93.5 80.0 - 100.0 fL   MCH 31.0 26.0 - 34.0 pg   MCHC 33.2 30.0 - 36.0 g/dL   RDW 12.7 11.5 - 15.5 %   Platelets 142 (L) 150 - 400 K/uL   nRBC 0.0 0.0 - 0.2 %  Urinalysis, Routine w reflex microscopic  Result Value Ref Range   Color, Urine YELLOW YELLOW   APPearance CLOUDY (A) CLEAR   Specific Gravity, Urine >1.030 (H) 1.005 - 1.030   pH 5.0 5.0 - 8.0   Glucose, UA NEGATIVE NEGATIVE mg/dL   Hgb urine dipstick LARGE (A) NEGATIVE   Bilirubin Urine NEGATIVE NEGATIVE   Ketones, ur NEGATIVE NEGATIVE mg/dL   Protein, ur 30 (A) NEGATIVE mg/dL   Nitrite NEGATIVE NEGATIVE   Leukocytes,Ua NEGATIVE NEGATIVE  Urinalysis,  Microscopic (reflex)  Result Value Ref Range   RBC / HPF >50 0 - 5 RBC/hpf   WBC, UA 0-5 0 - 5 WBC/hpf   Bacteria, UA MANY (A) NONE SEEN   Squamous Epithelial / LPF 0-5 0 - 5   Urine-Other MICROSCOPIC EXAM PERFORMED ON UNCONCENTRATED  URINE    CT Renal Stone Study  Result Date: 09/15/2019 CLINICAL DATA:  Right lower quadrant pain 3 hours. Vomiting and diarrhea. EXAM: CT ABDOMEN AND PELVIS WITHOUT CONTRAST TECHNIQUE: Multidetector CT imaging of the abdomen and pelvis was performed following the standard protocol without IV contrast. COMPARISON:  None. FINDINGS: Lower chest: Lung bases are normal. Hepatobiliary: Normal. Pancreas: Normal. Spleen: Normal. Adrenals/Urinary Tract: Adrenal glands are normal. Kidneys are normal in size. Exophytic 1.2 cm cyst over the mid pole left kidney. Punctate nonobstructing stone over the mid pole right kidney. There is mild right-sided hydronephrosis. There is a 2-3 mm stone over the right UVJ causing this low-grade obstruction. Left ureter is normal. No left-sided renal stones. Stomach/Bowel: Stomach and small bowel are normal. Appendix is normal. Mild diverticulosis of the colon. Vascular/Lymphatic: Normal. Reproductive: Previous hysterectomy. Other: No free fluid or focal inflammatory change. Musculoskeletal: Unremarkable. IMPRESSION: 1. Mild right-sided nephrolithiasis. 2-3 mm stone at the right UVJ causing low-grade obstruction. 2.  Mild colonic diverticulosis. Electronically Signed   By: Marin Olp M.D.   On: 09/15/2019 08:14    Procedures Procedures (including critical care time)  Medications Ordered in ED Medications  0.9 %  sodium chloride infusion ( Intravenous New Bag/Given 09/15/19 0736)  HYDROmorphone (DILAUDID) injection 1 mg (has no administration in time range)  sodium chloride 0.9 % bolus 500 mL (0 mLs Intravenous Stopped 09/15/19 0824)  ondansetron (ZOFRAN) injection 4 mg (4 mg Intravenous Given 09/15/19 0723)  HYDROmorphone (DILAUDID) injection 1 mg (1 mg Intravenous Given 09/15/19 [id number])    ED Course  I have reviewed the triage vital signs and the nursing notes.  Pertinent labs & imaging results that were available during my care of the patient were reviewed by me and  considered in my medical decision making (see chart for details).    MDM Rules/Calculators/A&P                      Patient's had a hysterectomy.  Acute onset of symptoms suggestive for perhaps renal stone.  Patient also has hematuria in the urine.  Mild leukocytosis and electrolytes including kidney function without any significant abnormalities.  Will get CT renal study.  We will treat patient with some IV fluids antinausea medicine and pain medicine.  Have not completely ruled out appendicitis based on the point tenderness to right lower quadrant this is a possibility.  CT scan confirms a distal right ureter stone 2 to 3 mm in size.  Right at the junction of the bladder.  Will treat symptomatically and give follow-up with urology.  Would expect patient to pass the stone.  Final Clinical Impression(s) / ED Diagnoses Final diagnoses:  Right lower quadrant abdominal pain  Right ureteral stone    Rx / DC Orders ED Discharge Orders    None       Fredia Sorrow, MD 09/15/19 [place], [place], MD 09/15/19 [phone]

## 2019-09-21 ENCOUNTER — Other Ambulatory Visit: Payer: Self-pay | Admitting: Urology

## 2019-09-21 NOTE — Progress Notes (Signed)
PCP - Stephanie Acre Cardiologist -   Chest x-ray -  EKG -  Stress Test -  ECHO -  Cardiac Cath -   Sleep Study -  CPAP -   Fasting Blood Sugar -  Checks Blood Sugar _____ times a day  Blood Thinner Instructions: Aspirin Instructions: Last Dose:  Anesthesia review:   Patient denies shortness of breath, fever, cough and chest pain at PAT appointment   Patient verbalized understanding of instructions that were given to them at the PAT appointment. Patient was also instructed that they will need to review over the PAT instructions again at home before surgery.

## 2019-09-21 NOTE — Patient Instructions (Addendum)
DUE TO COVID-19 ONLY ONE VISITOR IS ALLOWED TO COME WITH YOU AND STAY IN THE WAITING ROOM ONLY DURING PRE OP AND PROCEDURE DAY OF SURGERY. THE 1 VISITOR MAY VISIT WITH YOU AFTER SURGERY IN YOUR PRIVATE ROOM DURING VISITING HOURS ONLY!  YOU HAVE HAD A COVID 19 TEST.  PLEASE CONTINUE THE QUARANTINE INSTRUCTIONS AS OUTLINED IN YOUR HANDOUT.                Lori Hicks  09/21/2019   Your procedure is scheduled on: 09-26-18    Report to [facility] Main  Entrance     Report toAdmitting at 1:45 PM     Call this number if you have problems the morning of surgery [phone]    Remember: Do not eat food or drink liquids after midnight. You may have a Clear Liquid Diet from Midnight until 9:45 AM. After 9:45 AM, nothing until after surgery.       CLEAR LIQUID DIET   Foods Allowed                                                                     Foods Excluded  Coffee and tea, regular and decaf                             liquids that you cannot  Plain Jell-O any favor except red or purple                                           see through such as: Fruit ices (not with fruit pulp)                                     milk, soups, orange juice  Iced Popsicles                                    All solid food Carbonated beverages, regular and diet                                    Cranberry, grape and apple juices Sports drinks like Gatorade Lightly seasoned clear broth or consume(fat free) Sugar, honey syrup  Sample Menu Breakfast                                Lunch                                     Supper Cranberry juice                    Beef broth  Chicken broth Jell-O                                     Grape juice                           Apple juice Coffee or tea                        Jell-O                                      Popsicle                                                Coffee or tea                        Coffee or  tea  _____________________________________________________________________    Take these medicines the morning of surgery with A SIP OF WATER Synthroid, Tamsulosin (Flomax)  BRUSH YOUR TEETH MORNING OF SURGERY AND RINSE YOUR MOUTH OUT, NO CHEWING GUM CANDY OR MINTS.                                You may not have any metal on your body including hair pins and              piercings   Do not wear jewelry, make-up, lotions, powders or perfumes, deodorant              Do not wear nail polish on your fingernails.  Do not shave  48 hours prior to surgery.                 Do not bring valuables to the hospital. [place].  Contacts, dentures or bridgework may not be worn into surgery.      Patients discharged the day of surgery will not be allowed to drive home. IF YOU ARE HAVING SURGERY AND GOING HOME THE SAME DAY, YOU MUST HAVE AN ADULT TO DRIVE YOU HOME AND BE WITH YOU FOR 24 HOURS. YOU MAY GO HOME BY TAXI OR UBER OR ORTHERWISE, BUT AN ADULT MUST ACCOMPANY YOU HOME AND STAY WITH YOU FOR 24 HOURS.  Name and phone number of your driver: [name] [phone]  Special Instructions: N/A              Please read over the following fact sheets you were given: _____________________________________________________________________             [facility] - Preparing for Surgery Before surgery, you can play an important role.  Because skin is not sterile, your skin needs to be as free of germs as possible.  You can reduce the number of germs on your skin by washing with CHG (chlorahexidine gluconate) soap before surgery.  CHG is an antiseptic cleaner which kills germs and bonds with the skin to continue killing germs even after washing. Please DO NOT  use if you have an allergy to CHG or antibacterial soaps.  If your skin becomes reddened/irritated stop using the CHG and inform your nurse when you arrive at Short Stay. Do not shave  (including legs and underarms) for at least 48 hours prior to the first CHG shower.  You may shave your face/neck. Please follow these instructions carefully:  1.  Shower with CHG Soap the night before surgery and the  morning of Surgery.  2.  If you choose to wash your hair, wash your hair first as usual with your  normal  shampoo.  3.  After you shampoo, rinse your hair and body thoroughly to remove the  shampoo.                           4.  Use CHG as you would any other liquid soap.  You can apply chg directly  to the skin and wash                       Gently with a scrungie or clean washcloth.  5.  Apply the CHG Soap to your body ONLY FROM THE NECK DOWN.   Do not use on face/ open                           Wound or open sores. Avoid contact with eyes, ears mouth and genitals (private parts).                       Wash face,  Genitals (private parts) with your normal soap.             6.  Wash thoroughly, paying special attention to the area where your surgery  will be performed.  7.  Thoroughly rinse your body with warm water from the neck down.  8.  DO NOT shower/wash with your normal soap after using and rinsing off  the CHG Soap.                9.  Pat yourself dry with a clean towel.            10.  Wear clean pajamas.            11.  Place clean sheets on your bed the night of your first shower and do not  sleep with pets. Day of Surgery : Do not apply any lotions/deodorants the morning of surgery.  Please wear clean clothes to the hospital/surgery center.  FAILURE TO FOLLOW THESE INSTRUCTIONS MAY RESULT IN THE CANCELLATION OF YOUR SURGERY PATIENT SIGNATURE_________________________________  NURSE SIGNATURE__________________________________  ________________________________________________________________________

## 2019-09-21 NOTE — Progress Notes (Signed)
Please place orders in epic for upcoming surgery on 1/6. TY

## 2019-09-23 ENCOUNTER — Other Ambulatory Visit (HOSPITAL_COMMUNITY)
Admission: RE | Admit: 2019-09-23 | Discharge: 2019-09-23 | Disposition: A | Discharge status: Home / Self Care | Payer: Managed Care, Other (non HMO) | Source: Ambulatory Visit | Attending: Urology | Admitting: Urology

## 2019-09-23 DIAGNOSIS — U071 COVID-19: Secondary | ICD-10-CM | POA: Diagnosis not present

## 2019-09-23 DIAGNOSIS — Z20822 Contact with and (suspected) exposure to covid-19: Secondary | ICD-10-CM | POA: Diagnosis present

## 2019-09-24 LAB — NOVEL CORONAVIRUS, NAA (HOSP ORDER, SEND-OUT TO REF LAB; TAT 18-24 HRS): SARS-CoV-2, NAA: DETECTED — AB

## 2019-09-25 NOTE — Progress Notes (Signed)
Left VM with surgical scheduler for Dr. Tresa Moore regarding's pt's covid results.   Jacqlyn Larsen, RN

## 2019-09-26 ENCOUNTER — Encounter (HOSPITAL_COMMUNITY): Payer: Self-pay

## 2019-09-26 ENCOUNTER — Encounter (HOSPITAL_COMMUNITY)
Admission: RE | Admit: 2019-09-26 | Discharge: 2019-09-26 | Disposition: A | Discharge status: Home / Self Care | Payer: Managed Care, Other (non HMO) | Source: Ambulatory Visit | Attending: Urology | Admitting: Urology

## 2019-09-26 ENCOUNTER — Other Ambulatory Visit: Payer: Self-pay

## 2019-09-26 ENCOUNTER — Encounter (HOSPITAL_COMMUNITY): Admission: RE | Admit: 2019-09-26 | Payer: Managed Care, Other (non HMO) | Source: Ambulatory Visit

## 2019-09-26 MED ORDER — GENTAMICIN SULFATE 40 MG/ML IJ SOLN
5.0000 mg/kg | INTRAVENOUS | Status: AC
  Administered 2019-09-27: 380 mg via INTRAVENOUS
  Filled 2019-09-26: qty 9.5

## 2019-09-27 ENCOUNTER — Ambulatory Visit (HOSPITAL_COMMUNITY)
Admission: RE | Admit: 2019-09-27 | Discharge: 2019-09-27 | Disposition: A | Discharge status: Home / Self Care | Payer: No Typology Code available for payment source | Attending: Urology | Admitting: Urology

## 2019-09-27 ENCOUNTER — Other Ambulatory Visit: Payer: Self-pay

## 2019-09-27 ENCOUNTER — Encounter (HOSPITAL_COMMUNITY): Payer: Self-pay | Admitting: Urology

## 2019-09-27 ENCOUNTER — Ambulatory Visit (HOSPITAL_COMMUNITY): Payer: No Typology Code available for payment source | Admitting: Physician Assistant

## 2019-09-27 ENCOUNTER — Encounter (HOSPITAL_COMMUNITY)
Admission: RE | Disposition: A | Discharge status: Home / Self Care | Payer: Self-pay | Source: Home / Self Care | Attending: Urology

## 2019-09-27 ENCOUNTER — Ambulatory Visit (HOSPITAL_COMMUNITY): Payer: No Typology Code available for payment source | Admitting: Registered Nurse

## 2019-09-27 ENCOUNTER — Ambulatory Visit (HOSPITAL_COMMUNITY): Payer: No Typology Code available for payment source

## 2019-09-27 DIAGNOSIS — N201 Calculus of ureter: Secondary | ICD-10-CM | POA: Diagnosis present

## 2019-09-27 DIAGNOSIS — Z809 Family history of malignant neoplasm, unspecified: Secondary | ICD-10-CM | POA: Insufficient documentation

## 2019-09-27 DIAGNOSIS — N132 Hydronephrosis with renal and ureteral calculous obstruction: Secondary | ICD-10-CM | POA: Insufficient documentation

## 2019-09-27 DIAGNOSIS — Z8673 Personal history of transient ischemic attack (TIA), and cerebral infarction without residual deficits: Secondary | ICD-10-CM | POA: Insufficient documentation

## 2019-09-27 DIAGNOSIS — Z90710 Acquired absence of both cervix and uterus: Secondary | ICD-10-CM | POA: Diagnosis not present

## 2019-09-27 DIAGNOSIS — Z6841 Body Mass Index (BMI) 40.0 and over, adult: Secondary | ICD-10-CM | POA: Diagnosis not present

## 2019-09-27 DIAGNOSIS — U071 COVID-19: Secondary | ICD-10-CM | POA: Diagnosis not present

## 2019-09-27 DIAGNOSIS — Z91040 Latex allergy status: Secondary | ICD-10-CM | POA: Diagnosis not present

## 2019-09-27 DIAGNOSIS — Z882 Allergy status to sulfonamides status: Secondary | ICD-10-CM | POA: Insufficient documentation

## 2019-09-27 DIAGNOSIS — Z825 Family history of asthma and other chronic lower respiratory diseases: Secondary | ICD-10-CM | POA: Diagnosis not present

## 2019-09-27 DIAGNOSIS — Z881 Allergy status to other antibiotic agents status: Secondary | ICD-10-CM | POA: Insufficient documentation

## 2019-09-27 DIAGNOSIS — Z8249 Family history of ischemic heart disease and other diseases of the circulatory system: Secondary | ICD-10-CM | POA: Insufficient documentation

## 2019-09-27 DIAGNOSIS — E669 Obesity, unspecified: Secondary | ICD-10-CM | POA: Insufficient documentation

## 2019-09-27 DIAGNOSIS — I498 Other specified cardiac arrhythmias: Secondary | ICD-10-CM | POA: Diagnosis not present

## 2019-09-27 DIAGNOSIS — Z91013 Allergy to seafood: Secondary | ICD-10-CM | POA: Diagnosis not present

## 2019-09-27 DIAGNOSIS — Z833 Family history of diabetes mellitus: Secondary | ICD-10-CM | POA: Insufficient documentation

## 2019-09-27 HISTORY — PX: CYSTOSCOPY/URETEROSCOPY/HOLMIUM LASER/STENT PLACEMENT: SHX6546

## 2019-09-27 LAB — BASIC METABOLIC PANEL
Anion gap: 10 (ref 5–15)
BUN: 15 mg/dL (ref 6–20)
CO2: 25 mmol/L (ref 22–32)
Calcium: 8.8 mg/dL — ABNORMAL LOW (ref 8.9–10.3)
Chloride: 105 mmol/L (ref 98–111)
Creatinine, Ser: 0.77 mg/dL (ref 0.44–1.00)
GFR calc Af Amer: >60 mL/min (ref >60)
GFR calc non Af Amer: >60 mL/min (ref >60)
Glucose, Bld: 94 mg/dL (ref 70–99)
Potassium: 4.1 mmol/L (ref 3.5–5.1)
Sodium: 140 mmol/L (ref 135–145)

## 2019-09-27 LAB — CBC
HCT: 44.2 % (ref 36.0–46.0)
Hemoglobin: 14.6 g/dL (ref 12.0–15.0)
MCH: 30.9 pg (ref 26.0–34.0)
MCHC: 33 g/dL (ref 30.0–36.0)
MCV: 93.6 fL (ref 80.0–100.0)
Platelets: 193 10*3/uL (ref 150–400)
RBC: 4.72 MIL/uL (ref 3.87–5.11)
RDW: 12.1 % (ref 11.5–15.5)
WBC: 6.3 10*3/uL (ref 4.0–10.5)
nRBC: 0 % (ref 0.0–0.2)

## 2019-09-27 SURGERY — CYSTOSCOPY/URETEROSCOPY/HOLMIUM LASER/STENT PLACEMENT
Anesthesia: General | Laterality: Right

## 2019-09-27 MED ORDER — PROMETHAZINE HCL 25 MG/ML IJ SOLN
6.2500 mg | INTRAMUSCULAR | Status: AC | PRN
  Administered 2019-09-27: 12.5 mg via INTRAVENOUS

## 2019-09-27 MED ORDER — OXYCODONE-ACETAMINOPHEN 5-325 MG PO TABS: 1.0000 | ORAL_TABLET | Freq: Three times a day (TID) | ORAL | 0 refills | Status: AC | PRN

## 2019-09-27 MED ORDER — ONDANSETRON HCL 4 MG/2ML IJ SOLN
INTRAMUSCULAR | Status: DC | PRN
  Administered 2019-09-27: 4 mg via INTRAVENOUS

## 2019-09-27 MED ORDER — FENTANYL CITRATE (PF) 100 MCG/2ML IJ SOLN
INTRAMUSCULAR | Status: AC
  Filled 2019-09-27: qty 2

## 2019-09-27 MED ORDER — 0.9 % SODIUM CHLORIDE (POUR BTL) OPTIME
TOPICAL | Status: DC | PRN
  Administered 2019-09-27: 1000 mL

## 2019-09-27 MED ORDER — SODIUM CHLORIDE 0.9 % IR SOLN
Status: DC | PRN
  Administered 2019-09-27: 3000 mL

## 2019-09-27 MED ORDER — PROPOFOL 10 MG/ML IV BOLUS
INTRAVENOUS | Status: DC | PRN
  Administered 2019-09-27: 200 mg via INTRAVENOUS

## 2019-09-27 MED ORDER — FENTANYL CITRATE (PF) 100 MCG/2ML IJ SOLN: 25.0000 ug | INTRAMUSCULAR | Status: DC | PRN

## 2019-09-27 MED ORDER — LIDOCAINE 2% (20 MG/ML) 5 ML SYRINGE
INTRAMUSCULAR | Status: DC | PRN
  Administered 2019-09-27: 60 mg via INTRAVENOUS

## 2019-09-27 MED ORDER — FENTANYL CITRATE (PF) 250 MCG/5ML IJ SOLN
INTRAMUSCULAR | Status: AC
  Filled 2019-09-27: qty 5

## 2019-09-27 MED ORDER — PROPOFOL 10 MG/ML IV BOLUS
INTRAVENOUS | Status: AC
  Filled 2019-09-27: qty 20

## 2019-09-27 MED ORDER — FENTANYL CITRATE (PF) 100 MCG/2ML IJ SOLN
INTRAMUSCULAR | Status: DC | PRN
  Administered 2019-09-27 (×2): 100 ug via INTRAVENOUS

## 2019-09-27 MED ORDER — MIDAZOLAM HCL 2 MG/2ML IJ SOLN
INTRAMUSCULAR | Status: AC
  Filled 2019-09-27: qty 2

## 2019-09-27 MED ORDER — LACTATED RINGERS IV SOLN: INTRAVENOUS | Status: DC

## 2019-09-27 MED ORDER — KETOROLAC TROMETHAMINE 10 MG PO TABS: 10.0000 mg | ORAL_TABLET | Freq: Four times a day (QID) | ORAL | 0 refills | Status: AC | PRN

## 2019-09-27 MED ORDER — SUCCINYLCHOLINE CHLORIDE 20 MG/ML IJ SOLN
INTRAMUSCULAR | Status: DC | PRN
  Administered 2019-09-27: 140 mg via INTRAVENOUS

## 2019-09-27 MED ORDER — MIDAZOLAM HCL 5 MG/5ML IJ SOLN
INTRAMUSCULAR | Status: DC | PRN
  Administered 2019-09-27: 2 mg via INTRAVENOUS

## 2019-09-27 MED ORDER — IOHEXOL 300 MG/ML  SOLN
INTRAMUSCULAR | Status: DC | PRN
  Administered 2019-09-27: 6 mL

## 2019-09-27 MED ORDER — PROMETHAZINE HCL 25 MG/ML IJ SOLN
INTRAMUSCULAR | Status: AC
  Administered 2019-09-27: 6.25 mg via INTRAVENOUS
  Filled 2019-09-27: qty 1

## 2019-09-27 MED ORDER — DEXAMETHASONE SODIUM PHOSPHATE 10 MG/ML IJ SOLN
INTRAMUSCULAR | Status: DC | PRN
  Administered 2019-09-27: 10 mg via INTRAVENOUS

## 2019-09-27 SURGICAL SUPPLY — 23 items
BAG URO CATCHER STRL LF (MISCELLANEOUS) ×3 IMPLANT
BASKET LASER NITINOL 1.9FR (BASKET) IMPLANT
CATH INTERMIT  6FR 70CM (CATHETERS) ×5 IMPLANT
CLOTH BEACON ORANGE TIMEOUT ST (SAFETY) ×3 IMPLANT
EXTRACTOR STONE 1.7FRX115CM (UROLOGICAL SUPPLIES) IMPLANT
FIBER LASER FLEXIVA 1000 (UROLOGICAL SUPPLIES) IMPLANT
FIBER LASER FLEXIVA 365 (UROLOGICAL SUPPLIES) IMPLANT
FIBER LASER FLEXIVA 550 (UROLOGICAL SUPPLIES) IMPLANT
FIBER LASER TRAC TIP (UROLOGICAL SUPPLIES) IMPLANT
GLOVE BIOGEL M STRL SZ7.5 (GLOVE) ×3 IMPLANT
GOWN STRL REUS W/TWL LRG LVL3 (GOWN DISPOSABLE) ×3 IMPLANT
GUIDEWIRE ANG ZIPWIRE 038X150 (WIRE) ×3 IMPLANT
GUIDEWIRE STR DUAL SENSOR (WIRE) ×3 IMPLANT
KIT TURNOVER KIT A (KITS) IMPLANT
MANIFOLD NEPTUNE II (INSTRUMENTS) ×3 IMPLANT
PACK CYSTO (CUSTOM PROCEDURE TRAY) ×3 IMPLANT
SHEATH URETERAL 12FRX28CM (UROLOGICAL SUPPLIES) IMPLANT
SHEATH URETERAL 12FRX35CM (MISCELLANEOUS) IMPLANT
STENT POLARIS 5FRX24 (STENTS) ×2 IMPLANT
TUBE FEEDING 8FR 16IN STR KANG (MISCELLANEOUS) ×3 IMPLANT
TUBING CONNECTING 10 (TUBING) ×2 IMPLANT
TUBING CONNECTING 10' (TUBING) ×1
TUBING UROLOGY SET (TUBING) ×5 IMPLANT

## 2019-09-27 NOTE — H&P (Signed)
Lori Hicks is an 44 y.o. female.    Chief Complaint: Pre-Op RIGHT Ureteroscopic Stone Manipulation  HPI:   1 - Small Right Ureteral Stone - 22mm Rt distal (intramural ureter) stone by ER CT 09/15/19 on eval RLQ pain. Stone solitary. Some bacteruria on ER UA. No fevers / leukocytosis. Cr 0.94.   2 - Small Left Renal Mass - 19mm left mid lateral mass that is solid, exophytic, with some ?macroscopic fat (?AML) incidetnal on stone CT from ER 08/2019. NO contrast imaging. 1 artery / 1 vein left renovascular anatomy.   PMH sig for obesity, hypothroid, anxiety/depression, benign hyst. NO ischemic CV diseas / blood thinners. Her PCP is Jonathon Jordan MD.   Today "Lori Hicks" is seen to proceed with RIGHT ureteroscopy for small stone that has failed medical therapy. She is C19 positive but no fevers or SOB. Her colic has been difficult to manage.     Past Medical History:  Diagnosis Date  . Allergy   . Asthma    CHILHOOD ASTHMA  . Back pain 1994   LOWER LUMBAR DISC PAIN   . Chronic fatigue AGE 89 0R 15   NOW RESOLVED, EPISTEIN BARR ALSO THEN  . Fibroids   . Headache(784.0)    from POT syndrome  . History of ITP AGE 58 MONTHS   TX DONE AS CHILD, NOT CURRENT PROBLEM PROBLEM  . PONV (postoperative nausea and vomiting)    Difficult to arouse  . Postural orthostatic tachycardia syndrome   . Stroke [facility])    MRI revealed possibility. NO RESIDUAL DEFICITS NOT CONFIRMED STROKE  2005 WAS YEAR    Past Surgical History:  Procedure Laterality Date  . ABDOMINAL HYSTERECTOMY    . EAR TUBES AS CHILD    . LAPAROSCOPIC VAGINAL HYSTERECTOMY WITH SALPINGECTOMY Bilateral 02/22/2018   Procedure: LAPAROSCOPIC ASSISTED VAGINAL HYSTERECTOMY WITH SALPINGECTOMY;  Surgeon: Marylynn Pearson, MD;  Location: [facility];  Service: Gynecology;  Laterality: Bilateral;  . NASAL SINUS SURGERY  2000  . TONSILLECTOMY  AS CHILD   AND ADENOIDS   . TONSILLECTOMY AND ADENOIDECTOMY     as a child  . TORN  MENISCUS REPAIR AND PLICO BAND REMOVAL Right 2014  . TUBAL LIGATION  04/28/2011   Procedure: ESSURE TUBAL STERILIZATION;  Surgeon: Elveria Royals;  Location: [facility];  Service: Gynecology;  Laterality: N/A;  Cervical Block Only    Family History  Problem Relation Age of Onset  . Stroke Mother   . Diabetes Father   . Asthma Brother   . Cancer Maternal Grandfather   . Heart disease Paternal Grandfather    Social History:  reports that she has never smoked. She has never used smokeless tobacco. She reports current alcohol use. She reports that she does not use drugs.  Allergies:  Allergies  Allergen Reactions  . Shellfish-Derived Products Anaphylaxis  . Erythromycin Other (See Comments)    Childhood reaction  . Latex     According to allergy tests done and the scratch tests as well  . Sulfonamide Derivatives Other (See Comments)    Childhood reaction    No medications prior to admission.    No results found for this or any previous visit (from the past 48 hour(s)). No results found.  Review of Systems  Constitutional: Positive for fatigue. Negative for fever.  HENT: Positive for congestion.   Genitourinary: Positive for flank pain.  All other systems reviewed and are negative.   Last menstrual period 01/31/2018. Physical Exam  Constitutional:  She appears well-developed.  HENT:  Head: Normocephalic.  Eyes: Pupils are equal, round, and reactive to light.  Cardiovascular: Normal rate.  Respiratory: Effort normal.  GI:  Stable large truncal obesity.   Genitourinary:    Genitourinary Comments: Moderate Rt CVAT   Musculoskeletal:        General: Normal range of motion.     Cervical back: Normal range of motion.  Neurological: She is alert.  Skin: Skin is warm.     Assessment/Plan  Proceed as planned with RIGHT ureteroscopic stone manipulation. Risks, benefits, alternatives, expected peri-op course discussed previously and reiterated today.   Hicks Frock,  MD 09/27/2019, 6:31 AM

## 2019-09-27 NOTE — Brief Op Note (Signed)
09/27/2019  5:46 PM  PATIENT:  Lori Hicks  44 y.o. female  PRE-OPERATIVE DIAGNOSIS:  RIGHT URETERAL STONE  POST-OPERATIVE DIAGNOSIS:  RIGHT URETERAL STONE  PROCEDURE:  Procedure(s): CYSTOSCOPY RIGHT RETROGRADE, RIGHT DIAGNOSTIC URETEROSCOPY/STENT PLACEMENT (Right)  SURGEON:  Surgeon(s) and Role:    * Alexis Frock, MD - Primary  PHYSICIAN ASSISTANT:   ASSISTANTS: none   ANESTHESIA:   general  EBL:  minimal   BLOOD ADMINISTERED:none  DRAINS: none   LOCAL MEDICATIONS USED:  NONE  SPECIMEN:  No Specimen  DISPOSITION OF SPECIMEN:  N/A  COUNTS:  YES  TOURNIQUET:  * No tourniquets in log *  DICTATION: .Other Dictation: Dictation Number [id number]  PLAN OF CARE: Discharge to home after PACU  PATIENT DISPOSITION:  PACU - hemodynamically stable.   Delay start of Pharmacological VTE agent (>24hrs) due to surgical blood loss or risk of bleeding: not applicable

## 2019-09-27 NOTE — Discharge Instructions (Signed)
1 - You may have urinary urgency (bladder spasms) and bloody urine on / off with stent in place. This is normal.  2 - Remove tethered stent on Friday morning at home by pulling string, then blue-white plastic tubing, and discarding. Office is open Friday if any acute problems arise.   3 -  Call MD or go to ER for fever >102, severe pain / nausea / vomiting not relieved by medications, or acute change in medical status

## 2019-09-27 NOTE — Anesthesia Preprocedure Evaluation (Signed)
Anesthesia Evaluation  Patient identified by MRN, date of birth, ID band Patient awake    Reviewed: Allergy & Precautions, NPO status , Patient's Chart, lab work & pertinent test results  History of Anesthesia Complications (+) PONV and history of anesthetic complications  Airway Mallampati: II  TM Distance: >3 FB Neck ROM: Full    Dental  (+) Teeth Intact, Dental Advisory Given   Pulmonary asthma ,  COVID-19 POSITIVE 09/23/2019    Pulmonary exam normal breath sounds clear to auscultation       Cardiovascular hypertension, Normal cardiovascular exam Rhythm:Regular Rate:Normal  POTS   Neuro/Psych  Headaches, CVA    GI/Hepatic negative GI ROS, Neg liver ROS,   Endo/Other  Morbid obesity  Renal/GU RIGHT URETERAL STONE     Musculoskeletal negative musculoskeletal ROS (+)   Abdominal   Peds  Hematology negative hematology ROS (+) H/o ITP as child    Anesthesia Other Findings Day of surgery medications reviewed with the patient.  Reproductive/Obstetrics                             Anesthesia Physical Anesthesia Plan  ASA: III  Anesthesia Plan: General   Post-op Pain Management:    Induction: Intravenous and Rapid sequence  PONV Risk Score and Plan: 4 or greater and Midazolam, Dexamethasone, Ondansetron, Diphenhydramine and Treatment may vary due to age or medical condition  Airway Management Planned: Oral ETT  Additional Equipment:   Intra-op Plan:   Post-operative Plan: Extubation in OR  Informed Consent: I have reviewed the patients History and Physical, chart, labs and discussed the procedure including the risks, benefits and alternatives for the proposed anesthesia with the patient or authorized representative who has indicated his/her understanding and acceptance.     Dental advisory given  Plan Discussed with: CRNA  Anesthesia Plan Comments:         Anesthesia  Quick Evaluation

## 2019-09-27 NOTE — Transfer of Care (Signed)
Immediate Anesthesia Transfer of Care Note  Patient: Lori Hicks  Procedure(s) Performed: CYSTOSCOPY RIGHT RETROGRADE, RIGHT DIAGNOSTIC URETEROSCOPY/STENT PLACEMENT (Right )  Patient Location: PACU  Anesthesia Type:General  Level of Consciousness: awake, alert  and oriented  Airway & Oxygen Therapy: Patient Spontanous Breathing and Patient connected to face mask oxygen  Post-op Assessment: Report given to RN and Post -op Vital signs reviewed and stable  Post vital signs: Reviewed and stable  Last Vitals:  Vitals Value Taken Time  BP 119/96 09/27/19 1800  Temp 36.9 C 09/27/19 1800  Pulse 79 09/27/19 1807  Resp 15 09/27/19 1807  SpO2 100 % 09/27/19 1807  Vitals shown include unvalidated device data.  Last Pain:  Vitals:   09/27/19 1800  TempSrc:   PainSc: 0-No pain      Patients Stated Pain Goal: 4 ([id number])  Complications: No apparent anesthesia complications

## 2019-09-27 NOTE — Anesthesia Postprocedure Evaluation (Signed)
Anesthesia Post Note  Patient: Lori Hicks  Procedure(s) Performed: CYSTOSCOPY RIGHT RETROGRADE, RIGHT DIAGNOSTIC URETEROSCOPY/STENT PLACEMENT (Right )     Patient location during evaluation: PACU Anesthesia Type: General Level of consciousness: awake and alert Pain management: pain level controlled Vital Signs Assessment: post-procedure vital signs reviewed and stable Respiratory status: spontaneous breathing, nonlabored ventilation and respiratory function stable Cardiovascular status: blood pressure returned to baseline and stable Postop Assessment: no apparent nausea or vomiting Anesthetic complications: no    Last Vitals:  Vitals:   09/27/19 1800 09/27/19 1815  BP: (!) 119/96   Pulse: 88 86  Resp: 14 14  Temp: 36.9 C   SpO2: 100% 98%    Last Pain:  Vitals:   09/27/19 1800  TempSrc:   PainSc: 0-No pain                 Catalina Gravel

## 2019-09-27 NOTE — Anesthesia Procedure Notes (Signed)
Procedure Name: Intubation Performed by: Leonna Schlee J, CRNA Pre-anesthesia Checklist: Patient identified, Emergency Drugs available, Suction available, Patient being monitored and Timeout performed Patient Re-evaluated:Patient Re-evaluated prior to induction Oxygen Delivery Method: Circle system utilized Preoxygenation: Pre-oxygenation with 100% oxygen Induction Type: IV induction and Rapid sequence Laryngoscope Size: Mac and 4 Grade View: Grade I Tube type: Oral Tube size: 7.0 mm Number of attempts: 1 Airway Equipment and Method: Stylet Placement Confirmation: ETT inserted through vocal cords under direct vision,  positive ETCO2 and breath sounds checked- equal and bilateral Secured at: 21 cm Tube secured with: Tape Dental Injury: Teeth and Oropharynx as per pre-operative assessment        

## 2019-09-28 NOTE — Op Note (Signed)
NAME: Lori Hicks, Lori Hicks MEDICAL RECORD [id number] ACCOUNT [id number] DATE OF BIRTH:1976/06/17 FACILITY: [facility] LOCATION: [facility] PHYSICIAN:Anterio Scheel Tresa Moore, MD  OPERATIVE REPORT  DATE OF PROCEDURE:  09/27/2019  PREOPERATIVE DIAGNOSES:   1.  Right ureteral stone, refractory colic 2.  Recent history of COVID-19 infection.  POSTOPERATIVE DIAGNOSIS:  Interval passage of right ureteral stone.  PROCEDURE: 1.  Cystoscopy, right retrograde pyelogram with interpretation. 2.  Right diagnostic ureteroscopy. 3.  Insertion of right ureteral stent, 5 x 24 Polaris with tether.  SURGEON:  Alexis Frock, MD  ESTIMATED BLOOD LOSS:  Nil.  COMPLICATIONS:  None.  SPECIMENS:  None.  FINDINGS: 1.  Mild to moderate edema in the right distal intramural ureter consistent with likely recent stone passage. 2.  No evidence of intraluminal urolithiasis with complete inspection of the right ureter and kidney. 3.  Successful placement of right ureteral stent, proximal end curled in the renal pelvis, distal end in the urinary bladder.  INDICATIONS:   The patient is a pleasant 44 year old lady who was found on workup for colicky flank pain to have a right distal ureteral stone, approximately 3 mm and significant hydronephrosis.  She also had significant malaise at the time, but no  obvious urinary infectious parameters.  Options were discussed for management including recommended path of medical therapy with a bridge to ureteroscopy if failure to pass stone as she does have a high likelihood of medical passage alone and she wished  to proceed with this.  She has been on medical therapy and straining all urine and does not report interval stone passage.  She does still have some colicky symptoms.  In the interval, she also tested positive for COVID-19.  She does have malaise, but no  significant pulmonary symptoms or troubles maintaining pulmonary status.  Her colic remains difficult to maintain.  Options were  once again discussed and she adamantly wished to proceed with ureteroscopy as planned with a goal of stone free.  Informed  consent was obtained and placed in the medical record.  DESCRIPTION OF PROCEDURE:  The patient being identified, procedure being right ureteroscopic stimulation, was confirmed, procedure timeout was performed.  Intravenous antibiotics were administered.  General anesthesia induced.  The patient was placed  into a low lithotomy position.  Sterile field was created, prepping and draping the patient's vagina, introitus and proximal thighs using noniodinated prep.  Next, cystourethroscopy was performed with a 21-French rigid cystoscope with offset lens.   Inspection of bladder revealed no diverticula, calcifications, papillary lesions.  There was some edema at the right ureteral orifice that was cannulated using a 6-French renal catheter and right pyelogram was obtained.  Right pyelogram demonstrated a single right ureter, single system right kidney.  There is some mild hydroureteronephrosis to the distal ureter.  There were no obvious filling defects.  A ZIPwire was advanced to lower pole and set aside as a safety wire.   An 8-French feeding tube was placed in the urinary bladder, pressure released and semi-rigid ureteroscopy of the distal four-fifths of the right ureter alongside a separate sensor working wire.  With very careful inspection of the distal four-fifths of  the ureter, there was no intraluminal calcification seen whatsoever.  This was inspected x2.  This was felt to likely represent either interval stone passage or retrograde positioning of stone with wire passage.  As the goal today was to verify stone  free status, the semirigid scope was exchanged for the single-channel digital ureteroscope over the sensor working wire to the level  of the kidney using fluoroscopic guidance and flexible digital ureteroscopy was performed the right kidney, including all  calices x3.   There was no evidence of intraluminal calcification seen whatsoever.  The scope was once again withdrawn with panendoscopic examination of the right ureter.  Again, there was no evidence of intraluminal urolithiasis.  There was again some  mucosal edema at the very distal ureter, intramural and ureteral orifice.  Given her mild hydronephrosis at presentation today, it was felt that this mucosal edema may still be presenting some obstruction.  As such, decision was made to place a tethered  stent and a new 5 x 24 Polaris-type stent was placed over the remaining safety wire using fluoroscopic guidance.  Good proximal and distal curl were noted.  Tether was left in place and tucked per vagina.  The procedure was terminated.  The patient  tolerated the procedure well.  No immediate complications.  The patient was taken to postanesthesia care unit in isolation area in stable condition.  Plan for discharge home.  VN/NUANCE  D:09/27/2019 T:09/27/2019 JOB:[id number]

## 2020-05-31 ENCOUNTER — Other Ambulatory Visit (HOSPITAL_BASED_OUTPATIENT_CLINIC_OR_DEPARTMENT_OTHER): Payer: Self-pay

## 2020-05-31 DIAGNOSIS — G4710 Hypersomnia, unspecified: Secondary | ICD-10-CM

## 2020-06-23 ENCOUNTER — Other Ambulatory Visit: Payer: Self-pay

## 2020-06-23 ENCOUNTER — Ambulatory Visit (HOSPITAL_BASED_OUTPATIENT_CLINIC_OR_DEPARTMENT_OTHER): Payer: No Typology Code available for payment source | Attending: Internal Medicine | Admitting: Internal Medicine

## 2020-06-23 DIAGNOSIS — G4733 Obstructive sleep apnea (adult) (pediatric): Secondary | ICD-10-CM | POA: Insufficient documentation

## 2020-06-23 DIAGNOSIS — G4710 Hypersomnia, unspecified: Secondary | ICD-10-CM

## 2020-06-24 ENCOUNTER — Ambulatory Visit (HOSPITAL_BASED_OUTPATIENT_CLINIC_OR_DEPARTMENT_OTHER): Payer: No Typology Code available for payment source | Attending: Internal Medicine | Admitting: Internal Medicine

## 2020-06-24 DIAGNOSIS — G4710 Hypersomnia, unspecified: Secondary | ICD-10-CM

## 2020-06-24 DIAGNOSIS — G47419 Narcolepsy without cataplexy: Secondary | ICD-10-CM | POA: Diagnosis not present

## 2020-06-28 NOTE — Procedures (Signed)
   NAME: Lori Hicks DATE OF BIRTH:  Jul 28, 1976 MEDICAL RECORD NUMBER [id number]  LOCATION: [facility]  PHYSICIAN: Marius Ditch  DATE OF STUDY: 06/23/2020  SLEEP STUDY TYPE: Nocturnal Polysomnogram               REFERRING PHYSICIAN: Marius Ditch, MD  INDICATION FOR STUDY: excessive daytime sleepiness times many years with normal HSAT  EPWORTH SLEEPINESS SCORE:  18 HEIGHT: 5\' 3"  (160 cm)  WEIGHT: 262 lb (118.8 kg)    Body mass index is 46.41 kg/m.  NECK SIZE: 16 in.  MEDICATIONS Patient self administered medications include: N/A. Medications administered during study include No sleep medicine administered.Marland Kitchen  SLEEP STUDY TECHNIQUE A multi-channel overnight Polysomnography study was performed. The channels recorded and monitored were central and occipital EEG, electrooculogram (EOG), submentalis EMG (chin), nasal and oral airflow, thoracic and abdominal wall motion, anterior tibialis EMG, snore microphone, electrocardiogram, and a pulse oximetry.  TECHNICAL COMMENTS Comments added by Technician: None Comments added by Scorer: N/A  SLEEP ARCHITECTURE The study was initiated at 10:13:18 PM and terminated at 6:00:36 AM. The total recorded time was 467.3 minutes. EEG confirmed total sleep time was 324 minutes yielding a sleep efficiency of 69.3%%. Sleep onset after lights out was 23.3 minutes with a REM latency of 106.5 minutes. The patient spent 9.6%% of the night in stage N1 sleep, 60.2%% in stage N2 sleep, 0.0%% in stage N3 and 30.3% in REM. Wake after sleep onset (WASO) was 120.0 minutes. The Arousal Index was 11.5/hour.  RESPIRATORY PARAMETERS There were a total of 59 respiratory disturbances out of which 0 were apneas ( 0 obstructive, 0 mixed, 0 central) and 59 hypopneas. The apnea/hypopnea index (AHI) was 10.9 events/hour. The central sleep apnea index was 0.0 events/hour. The REM AHI was 36.1 events/hour and NREM AHI was 0.0 events/hour. The supine  AHI was 15.0 events/hour and the non supine AHI was 3.2 events/hour. Respiratory disturbance index was 13.7 events/hour and in REM was 42.2 events/hour. Respiratory disturbances were associated with oxygen desaturation down to a nadir of 83.0% during sleep. The mean oxygen saturation during the study was 96.4%. The cumulative time under 88% oxygen saturation was 1.3 minutes.  LEG MOVEMENT DATA The total leg movements were 12 with a resulting leg movement index of 2.2/hr . Associated arousal with leg movement index was 0.4/hr.  CARDIAC DATA The underlying cardiac rhythm was most consistent with sinus rhythm. Mean heart rate during sleep was 84.7 bpm. Additional rhythm abnormalities include None.  IMPRESSIONS - Mild Obstructive Sleep apnea(OSA) - No significant periodic leg movements(PLMs) during sleep.   DIAGNOSIS - Obstructive Sleep Apnea (G47.33) - Excessive daytime sleepiness that is worse than usually expected with mild OSA  RECOMMENDATIONS - Consider treatment options for OSA - Since patient has held medications for MSLT, may proceed with MSLT.   Marius Ditch Sleep specialist, [place] [employer]  ELECTRONICALLY SIGNED ON:  06/28/2020, 10:39 AM [place] PH: [phone]   FX: [phone] [place]

## 2020-06-28 NOTE — Procedures (Signed)
    NAME: Lori Hicks DATE OF BIRTH:  1975/10/03 MEDICAL RECORD NUMBER [id number]  LOCATION: [facility]  PHYSICIAN: Marius Ditch  DATE OF STUDY: 06/24/2020  SLEEP STUDY TYPE: Multiple Sleep Latency Test               REFERRING PHYSICIAN: Marius Ditch, MD  INDICATION FOR STUDY: Severe excessive daytime sleepiness in patient with normal HSAT and mild OSA on PSG.   EPWORTH SLEEPINESS SCORE:  18 HEIGHT: 5\' 3"  (160 cm)  WEIGHT: 262 lb (118.8 kg)    Body mass index is 46.41 kg/m.  NECK SIZE: 16 in.  MEDICATIONS Patient self administered medications include: N/A. Medications administered during study include No sleep medicine administered.Marland Kitchen  SLEEP STUDY TECHNIQUE A multiple sleep latency test was performed. The channels recorded and monitored were central and occipital EEG, electrooculogram (EOG), submentalis EMG (chin), and electrocardiogram.  TECHNICAL COMMENTS Comments added by Technician: None Comments added by Scorer: N/A  IMPRESSIONS - Two sleep onset REMs present. (One epoch of REM also present in one nap at the 20 minute time limit). This study suggests narcolepsy. - Total number of naps attempted: 5 . Total number of naps with sleep attained: 5 - Pathologic sleepiness was evidenced by short mean sleep latency of 15 seconds  DIAGNOSIS - Narcolepsy  RECOMMENDATIONS - Return for follow up and management of narcolepsy  Marius Ditch Sleep specialist, American Board of Internal Medicine  ELECTRONICALLY SIGNED ON:  06/28/2020, 10:53 AM [place] PH: [phone]   FX: [phone] [place]

## 2021-03-20 ENCOUNTER — Other Ambulatory Visit: Payer: Self-pay | Admitting: Urology

## 2021-03-20 DIAGNOSIS — D4102 Neoplasm of uncertain behavior of left kidney: Secondary | ICD-10-CM

## 2021-04-12 ENCOUNTER — Other Ambulatory Visit: Payer: Self-pay

## 2021-04-12 ENCOUNTER — Ambulatory Visit
Admission: RE | Admit: 2021-04-12 | Discharge: 2021-04-12 | Disposition: A | Discharge status: Home / Self Care | Payer: No Typology Code available for payment source | Source: Ambulatory Visit | Attending: Urology | Admitting: Urology

## 2021-04-12 DIAGNOSIS — D4102 Neoplasm of uncertain behavior of left kidney: Secondary | ICD-10-CM

## 2021-04-12 MED ORDER — GADOBENATE DIMEGLUMINE 529 MG/ML IV SOLN
20.0000 mL | Freq: Once | INTRAVENOUS | Status: AC | PRN
  Administered 2021-04-12: 20 mL via INTRAVENOUS

## 2021-08-14 ENCOUNTER — Encounter (HOSPITAL_BASED_OUTPATIENT_CLINIC_OR_DEPARTMENT_OTHER): Payer: Self-pay | Admitting: *Deleted

## 2021-08-14 ENCOUNTER — Emergency Department (HOSPITAL_BASED_OUTPATIENT_CLINIC_OR_DEPARTMENT_OTHER)
Admission: EM | Admit: 2021-08-14 | Discharge: 2021-08-14 | Disposition: A | Discharge status: Home / Self Care | Payer: Self-pay | Attending: Emergency Medicine | Admitting: Emergency Medicine

## 2021-08-14 ENCOUNTER — Other Ambulatory Visit: Payer: Self-pay

## 2021-08-14 ENCOUNTER — Emergency Department (HOSPITAL_BASED_OUTPATIENT_CLINIC_OR_DEPARTMENT_OTHER): Payer: Self-pay | Admitting: Radiology

## 2021-08-14 DIAGNOSIS — J40 Bronchitis, not specified as acute or chronic: Secondary | ICD-10-CM | POA: Insufficient documentation

## 2021-08-14 DIAGNOSIS — M549 Dorsalgia, unspecified: Secondary | ICD-10-CM | POA: Insufficient documentation

## 2021-08-14 DIAGNOSIS — J45909 Unspecified asthma, uncomplicated: Secondary | ICD-10-CM | POA: Insufficient documentation

## 2021-08-14 DIAGNOSIS — Z91040 Latex allergy status: Secondary | ICD-10-CM | POA: Insufficient documentation

## 2021-08-14 DIAGNOSIS — R000 Tachycardia, unspecified: Secondary | ICD-10-CM | POA: Insufficient documentation

## 2021-08-14 DIAGNOSIS — Z20822 Contact with and (suspected) exposure to covid-19: Secondary | ICD-10-CM | POA: Insufficient documentation

## 2021-08-14 HISTORY — DX: Narcolepsy without cataplexy: G47.419

## 2021-08-14 HISTORY — DX: Disorder of thyroid, unspecified: E07.9

## 2021-08-14 LAB — RESP PANEL BY RT-PCR (FLU A&B, COVID) ARPGX2
Influenza A by PCR: NEGATIVE
Influenza B by PCR: NEGATIVE
SARS Coronavirus 2 by RT PCR: NEGATIVE

## 2021-08-14 MED ORDER — PREDNISONE 10 MG (21) PO TBPK: ORAL_TABLET | ORAL | 0 refills | Status: AC

## 2021-08-14 MED ORDER — AEROCHAMBER PLUS FLO-VU MISC
1.0000 | Freq: Once | Status: AC
  Administered 2021-08-14: 1
  Filled 2021-08-14: qty 1

## 2021-08-14 MED ORDER — ALBUTEROL SULFATE HFA 108 (90 BASE) MCG/ACT IN AERS
2.0000 | INHALATION_SPRAY | RESPIRATORY_TRACT | Status: DC | PRN
  Administered 2021-08-14: 2 via RESPIRATORY_TRACT
  Filled 2021-08-14: qty 6.7

## 2021-08-14 MED ORDER — PREDNISONE 50 MG PO TABS
60.0000 mg | ORAL_TABLET | Freq: Once | ORAL | Status: AC
  Administered 2021-08-14: 60 mg via ORAL
  Filled 2021-08-14: qty 1

## 2021-08-14 NOTE — ED Triage Notes (Signed)
3 weeks ago Covid, dry cough since which has worsened today, "feels like an elephant on front and back of chest" Headache worse with cough. Denies N/V/D Temp 100.2 Oral, took Tylenol about 1530

## 2021-08-14 NOTE — ED Provider Notes (Signed)
[facility] Provider Note  CSN: [id number] Arrival date & time: 08/14/21 1721    History Chief Complaint  Patient presents with   Chest Pain    Lori Hicks is a 45 y.o. female reports she had Covid about 3 weeks ago, had more or less recovered although she continued to have a mild dry cough which began worsening last night, associated with chest soreness, back pain and headache all worse when she coughs. She has not had a fever at home but noted to be 100.76F on arrival here. She has not had any N/V/D. 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    Past Medical History:  Diagnosis Date   Allergy    Asthma    CHILHOOD ASTHMA   Back pain 1994   LOWER LUMBAR DISC PAIN    Chronic fatigue AGE 89 0R 15   NOW RESOLVED, EPISTEIN BARR ALSO THEN   Fibroids    Headache(784.0)    from POT syndrome   History of ITP AGE 68 MONTHS   TX DONE AS CHILD, NOT CURRENT PROBLEM PROBLEM   Narcolepsy    PONV (postoperative nausea and vomiting)    Difficult to arouse   Postural orthostatic tachycardia syndrome    Stroke [facility])    MRI revealed possibility. NO RESIDUAL DEFICITS NOT CONFIRMED STROKE  2005 WAS YEAR   Thyroid disease     Past Surgical History:  Procedure Laterality Date   ABDOMINAL HYSTERECTOMY     CYSTOSCOPY/URETEROSCOPY/HOLMIUM LASER/STENT PLACEMENT Right 09/27/2019   Procedure: CYSTOSCOPY RIGHT RETROGRADE, RIGHT DIAGNOSTIC URETEROSCOPY/STENT PLACEMENT;  Surgeon: Alexis Frock, MD;  Location: [facility];  Service: Urology;  Laterality: Right;   EAR TUBES AS CHILD     LAPAROSCOPIC VAGINAL HYSTERECTOMY WITH SALPINGECTOMY Bilateral 02/22/2018   Procedure: LAPAROSCOPIC ASSISTED VAGINAL HYSTERECTOMY WITH SALPINGECTOMY;  Surgeon: Marylynn Pearson, MD;  Location: [facility];  Service: Gynecology;  Laterality: Bilateral;   NASAL SINUS SURGERY  2000   TONSILLECTOMY  AS CHILD   AND ADENOIDS    TONSILLECTOMY AND  ADENOIDECTOMY     as a child   TORN MENISCUS REPAIR AND PLICO BAND REMOVAL Right 2014   TUBAL LIGATION  04/28/2011   Procedure: ESSURE TUBAL STERILIZATION;  Surgeon: Elveria Royals;  Location: [facility];  Service: Gynecology;  Laterality: N/A;  Cervical Block Only    Family History  Problem Relation Age of Onset   Stroke Mother    Diabetes Father    Asthma Brother    Cancer Maternal Grandfather    Heart disease Paternal Grandfather     Social History   Tobacco Use   Smoking status: Never   Smokeless tobacco: Never  Vaping Use   Vaping Use: Never used  Substance Use Topics   Alcohol use: Not Currently   Drug use: No     Home Medications Prior to Admission medications   Medication Sig Start Date End Date Taking? Authorizing Provider  predniSONE (STERAPRED UNI-PAK 21 TAB) 10 MG (21) TBPK tablet 10mg  Tabs, 6 day taper. Use as directed 08/14/21  Yes Truddie Hidden, MD  acidophilus [facility]) CAPS capsule Take 2 capsules by mouth daily.    [provider]  Azelastine HCl 0.15 % SOLN Place 2 sprays into both nostrils daily. 09/11/19   [provider]  Cholecalciferol (PA VITAMIN D-3 GUMMY PO) Take 2,000 Units by mouth daily.    [provider]  doxycycline (VIBRAMYCIN) 100 MG capsule  Take 100 mg by mouth 2 (two) times daily. 09/13/19   [provider]  DULoxetine (CYMBALTA) 30 MG capsule Take 30 mg by mouth 2 (two) times daily. 07/06/19   [provider]  ketorolac (TORADOL) 10 MG tablet Take 1 tablet (10 mg total) by mouth every 6 (six) hours as needed for moderate pain (or stent discomfort post-operatively). 09/27/19   Alexis Frock, MD  Multiple Vitamin (MULTIVITAMIN WITH MINERALS) TABS tablet Take 1 tablet by mouth daily.    [provider]  ondansetron (ZOFRAN ODT) 4 MG disintegrating tablet Take 1 tablet (4 mg total) by mouth every 8 (eight) hours as needed. Patient not taking: Reported on 09/21/2019 09/15/19   Fredia Sorrow, MD  ondansetron (ZOFRAN-ODT) 8 MG disintegrating tablet Take 8 mg by mouth every 8 (eight) hours as needed. 09/19/19   [provider]  oxyCODONE-acetaminophen (PERCOCET/ROXICET) 5-325 MG tablet Take 1-2 tablets by mouth every 8 (eight) hours as needed for severe pain (Post-operatively). 09/27/19   Alexis Frock, MD  SYNTHROID 75 MCG tablet Take 75 mcg by mouth See admin instructions. Take 75 mcg by mouth before daily breakfast Monday - Saturday and take 37.5 mcg on Sunday 07/06/19   [provider]     Allergies    Shellfish-derived products, Erythromycin, Latex, and Sulfonamide derivatives   Review of Systems   Review of Systems A comprehensive review of systems was completed and negative except as noted in HPI.    Physical Exam BP (!) 146/106 (BP Location: Right Arm)   Pulse (!) 105   Temp 100.2 F (37.9 C) (Oral)   Resp 18   Ht 5\' 3"  (1.6 m)   Wt 115.7 kg   LMP 01/31/2018 Comment: Partial   SpO2 98%   BMI 45.17 kg/m   Physical Exam Vitals and nursing note reviewed.  Constitutional:      Appearance: Normal appearance.  HENT:     Head: Normocephalic and atraumatic.     Nose: Nose normal.     Mouth/Throat:     Mouth: Mucous membranes are moist.  Eyes:     Extraocular Movements: Extraocular movements intact.     Conjunctiva/sclera: Conjunctivae normal.  Cardiovascular:     Rate and Rhythm: Tachycardia present.  Pulmonary:     Effort: Pulmonary effort is normal.     Breath sounds: Normal breath sounds. No wheezing, rhonchi or rales.  Abdominal:     General: Abdomen is flat.     Palpations: Abdomen is soft.     Tenderness: There is no abdominal tenderness.  Musculoskeletal:        General: No swelling. Normal range of motion.     Cervical back: Neck supple.  Skin:    General: Skin is warm and dry.  Neurological:     General: No focal deficit present.     Mental Status: She is alert.  Psychiatric:        Mood and Affect: Mood normal.      ED Results / Procedures / Treatments   Labs (all labs ordered are listed, but only abnormal results are displayed) Labs Reviewed  RESP PANEL BY RT-PCR (FLU A&B, COVID) [id number]    EKG EKG Interpretation  Date/Time:  Thursday August 14 2021 17:31:32 EST Ventricular Rate:  106 PR Interval:  154 QRS Duration: 96 QT Interval:  324 QTC Calculation: 431 R Axis:   64 Text Interpretation: Sinus tachycardia Probable left atrial enlargement Since last tracing Rate faster Confirmed by Calvert Cantor [phone]) on  08/14/2021 5:34:32 PM   Radiology DG Chest 2 View  Result Date: 08/14/2021 CLINICAL DATA:  Cough, fever EXAM: CHEST - 2 VIEW COMPARISON:  03/07/2010 FINDINGS: The heart size and mediastinal contours are within normal limits. No focal airspace consolidation, pleural effusion, or pneumothorax. The visualized skeletal structures are unremarkable. IMPRESSION: No active cardiopulmonary disease. Electronically Signed   By: Davina Poke D.O.   On: 08/14/2021 17:51    Procedures Procedures  Medications Ordered in the ED Medications  albuterol (VENTOLIN HFA) 108 (90 Base) MCG/ACT inhaler 2 puff (2 puffs Inhalation Given 08/14/21 1931)  predniSONE (DELTASONE) tablet 60 mg (60 mg Oral Given 08/14/21 1926)  aerochamber plus with mask device 1 each (1 each Other Given 08/14/21 1931)     MDM Rules/Calculators/A&P MDM Patient with viral URI symptoms, recently had Covid, so not likely to be a recurrence. Could be influenza. Will check CXR and give NSAIDs for her discomfort. Symptoms are not consistent with ACS.   ED Course  I have reviewed the triage vital signs and the nursing notes.  Pertinent labs & imaging results that were available during my care of the patient were reviewed by me and considered in my medical decision making (see chart for details).  Clinical Course as of 08/14/21 1949  Thu Aug 14, 2021  1807 CXR is clear. Will give albuterol pending swab.  [CS]  7654  Covid/Flu are neg. Will treat with prednisone for bronchitis.  [CS]    Clinical Course User Index [CS] Truddie Hidden, MD    Final Clinical Impression(s) / ED Diagnoses Final diagnoses:  Bronchitis    Rx / DC Orders ED Discharge Orders          Ordered    predniSONE (STERAPRED UNI-PAK 21 TAB) 10 MG (21) TBPK tablet        08/14/21 1949             Truddie Hidden, MD 08/14/21 1949

## 2021-08-14 NOTE — ED Notes (Signed)
RT educated pt on proper use of MDI w/spacer. Pt also educated on importance of medication compliance w/her asthma. RT suggested pt see pulmonologist for a current PFT for better control of asthma symptoms.

## 2021-09-30 DIAGNOSIS — G4719 Other hypersomnia: Secondary | ICD-10-CM | POA: Diagnosis not present

## 2021-09-30 DIAGNOSIS — G4733 Obstructive sleep apnea (adult) (pediatric): Secondary | ICD-10-CM | POA: Diagnosis not present

## 2021-10-09 DIAGNOSIS — E039 Hypothyroidism, unspecified: Secondary | ICD-10-CM | POA: Diagnosis not present

## 2021-10-09 DIAGNOSIS — J45909 Unspecified asthma, uncomplicated: Secondary | ICD-10-CM | POA: Diagnosis not present

## 2021-10-09 DIAGNOSIS — L980 Pyogenic granuloma: Secondary | ICD-10-CM | POA: Diagnosis not present

## 2021-10-31 DIAGNOSIS — G4733 Obstructive sleep apnea (adult) (pediatric): Secondary | ICD-10-CM | POA: Diagnosis not present

## 2021-10-31 DIAGNOSIS — G4719 Other hypersomnia: Secondary | ICD-10-CM | POA: Diagnosis not present

## 2021-11-10 DIAGNOSIS — D1801 Hemangioma of skin and subcutaneous tissue: Secondary | ICD-10-CM | POA: Diagnosis not present

## 2021-11-10 DIAGNOSIS — D485 Neoplasm of uncertain behavior of skin: Secondary | ICD-10-CM | POA: Diagnosis not present

## 2021-11-28 DIAGNOSIS — G4733 Obstructive sleep apnea (adult) (pediatric): Secondary | ICD-10-CM | POA: Diagnosis not present

## 2021-11-28 DIAGNOSIS — G4719 Other hypersomnia: Secondary | ICD-10-CM | POA: Diagnosis not present

## 2021-12-01 DIAGNOSIS — L980 Pyogenic granuloma: Secondary | ICD-10-CM | POA: Diagnosis not present

## 2021-12-01 DIAGNOSIS — L989 Disorder of the skin and subcutaneous tissue, unspecified: Secondary | ICD-10-CM | POA: Diagnosis not present

## 2021-12-01 DIAGNOSIS — H938X2 Other specified disorders of left ear: Secondary | ICD-10-CM | POA: Diagnosis not present

## 2021-12-10 DIAGNOSIS — D1801 Hemangioma of skin and subcutaneous tissue: Secondary | ICD-10-CM | POA: Diagnosis not present

## 2021-12-10 DIAGNOSIS — L980 Pyogenic granuloma: Secondary | ICD-10-CM | POA: Diagnosis not present

## 2021-12-16 DIAGNOSIS — G4733 Obstructive sleep apnea (adult) (pediatric): Secondary | ICD-10-CM | POA: Diagnosis not present

## 2021-12-24 DIAGNOSIS — I10 Essential (primary) hypertension: Secondary | ICD-10-CM | POA: Diagnosis not present

## 2021-12-24 DIAGNOSIS — F430 Acute stress reaction: Secondary | ICD-10-CM | POA: Diagnosis not present

## 2021-12-29 DIAGNOSIS — G4719 Other hypersomnia: Secondary | ICD-10-CM | POA: Diagnosis not present

## 2021-12-29 DIAGNOSIS — G4733 Obstructive sleep apnea (adult) (pediatric): Secondary | ICD-10-CM | POA: Diagnosis not present

## 2022-01-15 DIAGNOSIS — R000 Tachycardia, unspecified: Secondary | ICD-10-CM | POA: Diagnosis not present

## 2022-01-15 DIAGNOSIS — Z6841 Body Mass Index (BMI) 40.0 and over, adult: Secondary | ICD-10-CM | POA: Diagnosis not present

## 2022-01-15 DIAGNOSIS — I10 Essential (primary) hypertension: Secondary | ICD-10-CM | POA: Diagnosis not present

## 2022-01-16 DIAGNOSIS — D1801 Hemangioma of skin and subcutaneous tissue: Secondary | ICD-10-CM | POA: Diagnosis not present

## 2022-01-16 DIAGNOSIS — D485 Neoplasm of uncertain behavior of skin: Secondary | ICD-10-CM | POA: Diagnosis not present

## 2022-01-23 DIAGNOSIS — G4733 Obstructive sleep apnea (adult) (pediatric): Secondary | ICD-10-CM | POA: Diagnosis not present

## 2022-04-14 DIAGNOSIS — D4102 Neoplasm of uncertain behavior of left kidney: Secondary | ICD-10-CM | POA: Diagnosis not present

## 2022-04-14 DIAGNOSIS — R3915 Urgency of urination: Secondary | ICD-10-CM | POA: Diagnosis not present

## 2022-04-14 DIAGNOSIS — N201 Calculus of ureter: Secondary | ICD-10-CM | POA: Diagnosis not present

## 2022-05-11 DIAGNOSIS — Z6841 Body Mass Index (BMI) 40.0 and over, adult: Secondary | ICD-10-CM | POA: Diagnosis not present

## 2022-05-11 DIAGNOSIS — Z01419 Encounter for gynecological examination (general) (routine) without abnormal findings: Secondary | ICD-10-CM | POA: Diagnosis not present

## 2022-05-11 DIAGNOSIS — Z1231 Encounter for screening mammogram for malignant neoplasm of breast: Secondary | ICD-10-CM | POA: Diagnosis not present

## 2022-05-11 DIAGNOSIS — Z1272 Encounter for screening for malignant neoplasm of vagina: Secondary | ICD-10-CM | POA: Diagnosis not present

## 2022-05-11 DIAGNOSIS — Z1151 Encounter for screening for human papillomavirus (HPV): Secondary | ICD-10-CM | POA: Diagnosis not present

## 2022-05-12 DIAGNOSIS — K921 Melena: Secondary | ICD-10-CM | POA: Diagnosis not present

## 2022-05-12 DIAGNOSIS — E7800 Pure hypercholesterolemia, unspecified: Secondary | ICD-10-CM | POA: Diagnosis not present

## 2022-05-12 DIAGNOSIS — E559 Vitamin D deficiency, unspecified: Secondary | ICD-10-CM | POA: Diagnosis not present

## 2022-05-12 DIAGNOSIS — Z0000 Encounter for general adult medical examination without abnormal findings: Secondary | ICD-10-CM | POA: Diagnosis not present

## 2022-05-12 DIAGNOSIS — E039 Hypothyroidism, unspecified: Secondary | ICD-10-CM | POA: Diagnosis not present

## 2022-05-19 DIAGNOSIS — G47419 Narcolepsy without cataplexy: Secondary | ICD-10-CM | POA: Diagnosis not present

## 2022-05-19 DIAGNOSIS — G4733 Obstructive sleep apnea (adult) (pediatric): Secondary | ICD-10-CM | POA: Diagnosis not present

## 2022-07-02 DIAGNOSIS — E7800 Pure hypercholesterolemia, unspecified: Secondary | ICD-10-CM | POA: Diagnosis not present

## 2022-07-02 DIAGNOSIS — Z1331 Encounter for screening for depression: Secondary | ICD-10-CM | POA: Diagnosis not present

## 2022-07-02 DIAGNOSIS — E039 Hypothyroidism, unspecified: Secondary | ICD-10-CM | POA: Diagnosis not present

## 2022-07-02 DIAGNOSIS — Z131 Encounter for screening for diabetes mellitus: Secondary | ICD-10-CM | POA: Diagnosis not present

## 2022-07-02 DIAGNOSIS — R635 Abnormal weight gain: Secondary | ICD-10-CM | POA: Diagnosis not present

## 2022-07-02 DIAGNOSIS — Z9189 Other specified personal risk factors, not elsewhere classified: Secondary | ICD-10-CM | POA: Diagnosis not present

## 2022-07-02 DIAGNOSIS — G4733 Obstructive sleep apnea (adult) (pediatric): Secondary | ICD-10-CM | POA: Diagnosis not present

## 2022-07-02 DIAGNOSIS — E559 Vitamin D deficiency, unspecified: Secondary | ICD-10-CM | POA: Diagnosis not present

## 2022-07-20 DIAGNOSIS — E7800 Pure hypercholesterolemia, unspecified: Secondary | ICD-10-CM | POA: Diagnosis not present

## 2022-07-20 DIAGNOSIS — E559 Vitamin D deficiency, unspecified: Secondary | ICD-10-CM | POA: Diagnosis not present

## 2022-07-20 DIAGNOSIS — R7303 Prediabetes: Secondary | ICD-10-CM | POA: Diagnosis not present

## 2022-07-20 DIAGNOSIS — I10 Essential (primary) hypertension: Secondary | ICD-10-CM | POA: Diagnosis not present

## 2022-08-05 DIAGNOSIS — E559 Vitamin D deficiency, unspecified: Secondary | ICD-10-CM | POA: Diagnosis not present

## 2022-08-05 DIAGNOSIS — I10 Essential (primary) hypertension: Secondary | ICD-10-CM | POA: Diagnosis not present

## 2022-08-05 DIAGNOSIS — E7800 Pure hypercholesterolemia, unspecified: Secondary | ICD-10-CM | POA: Diagnosis not present

## 2022-08-05 DIAGNOSIS — R7303 Prediabetes: Secondary | ICD-10-CM | POA: Diagnosis not present

## 2022-10-14 DIAGNOSIS — E7800 Pure hypercholesterolemia, unspecified: Secondary | ICD-10-CM | POA: Diagnosis not present

## 2022-10-14 DIAGNOSIS — I10 Essential (primary) hypertension: Secondary | ICD-10-CM | POA: Diagnosis not present

## 2022-10-14 DIAGNOSIS — E559 Vitamin D deficiency, unspecified: Secondary | ICD-10-CM | POA: Diagnosis not present

## 2022-10-14 DIAGNOSIS — R7303 Prediabetes: Secondary | ICD-10-CM | POA: Diagnosis not present

## 2022-10-14 DIAGNOSIS — R233 Spontaneous ecchymoses: Secondary | ICD-10-CM | POA: Diagnosis not present

## 2022-10-30 DIAGNOSIS — J029 Acute pharyngitis, unspecified: Secondary | ICD-10-CM | POA: Diagnosis not present

## 2022-10-30 DIAGNOSIS — R051 Acute cough: Secondary | ICD-10-CM | POA: Diagnosis not present

## 2022-10-30 DIAGNOSIS — Z03818 Encounter for observation for suspected exposure to other biological agents ruled out: Secondary | ICD-10-CM | POA: Diagnosis not present

## 2022-10-30 DIAGNOSIS — J069 Acute upper respiratory infection, unspecified: Secondary | ICD-10-CM | POA: Diagnosis not present

## 2022-11-12 DIAGNOSIS — E7800 Pure hypercholesterolemia, unspecified: Secondary | ICD-10-CM | POA: Diagnosis not present

## 2022-11-12 DIAGNOSIS — E559 Vitamin D deficiency, unspecified: Secondary | ICD-10-CM | POA: Diagnosis not present

## 2022-11-12 DIAGNOSIS — R7303 Prediabetes: Secondary | ICD-10-CM | POA: Diagnosis not present

## 2022-11-12 DIAGNOSIS — I10 Essential (primary) hypertension: Secondary | ICD-10-CM | POA: Diagnosis not present

## 2022-11-16 DIAGNOSIS — G47419 Narcolepsy without cataplexy: Secondary | ICD-10-CM | POA: Diagnosis not present

## 2022-11-16 DIAGNOSIS — G4733 Obstructive sleep apnea (adult) (pediatric): Secondary | ICD-10-CM | POA: Diagnosis not present

## 2022-11-23 DIAGNOSIS — N9089 Other specified noninflammatory disorders of vulva and perineum: Secondary | ICD-10-CM | POA: Diagnosis not present

## 2022-12-02 DIAGNOSIS — R208 Other disturbances of skin sensation: Secondary | ICD-10-CM | POA: Diagnosis not present

## 2022-12-02 DIAGNOSIS — L538 Other specified erythematous conditions: Secondary | ICD-10-CM | POA: Diagnosis not present

## 2022-12-02 DIAGNOSIS — L918 Other hypertrophic disorders of the skin: Secondary | ICD-10-CM | POA: Diagnosis not present

## 2022-12-02 DIAGNOSIS — L298 Other pruritus: Secondary | ICD-10-CM | POA: Diagnosis not present

## 2022-12-28 DIAGNOSIS — E559 Vitamin D deficiency, unspecified: Secondary | ICD-10-CM | POA: Diagnosis not present

## 2022-12-28 DIAGNOSIS — I10 Essential (primary) hypertension: Secondary | ICD-10-CM | POA: Diagnosis not present

## 2022-12-28 DIAGNOSIS — R7303 Prediabetes: Secondary | ICD-10-CM | POA: Diagnosis not present

## 2022-12-28 DIAGNOSIS — E7800 Pure hypercholesterolemia, unspecified: Secondary | ICD-10-CM | POA: Diagnosis not present

## 2023-01-28 DIAGNOSIS — E559 Vitamin D deficiency, unspecified: Secondary | ICD-10-CM | POA: Diagnosis not present

## 2023-01-28 DIAGNOSIS — R7303 Prediabetes: Secondary | ICD-10-CM | POA: Diagnosis not present

## 2023-01-28 DIAGNOSIS — E7800 Pure hypercholesterolemia, unspecified: Secondary | ICD-10-CM | POA: Diagnosis not present

## 2023-01-28 DIAGNOSIS — I10 Essential (primary) hypertension: Secondary | ICD-10-CM | POA: Diagnosis not present

## 2023-02-25 DIAGNOSIS — E7800 Pure hypercholesterolemia, unspecified: Secondary | ICD-10-CM | POA: Diagnosis not present

## 2023-02-25 DIAGNOSIS — R7303 Prediabetes: Secondary | ICD-10-CM | POA: Diagnosis not present

## 2023-02-25 DIAGNOSIS — I10 Essential (primary) hypertension: Secondary | ICD-10-CM | POA: Diagnosis not present

## 2023-02-25 DIAGNOSIS — E559 Vitamin D deficiency, unspecified: Secondary | ICD-10-CM | POA: Diagnosis not present

## 2023-03-09 DIAGNOSIS — R000 Tachycardia, unspecified: Secondary | ICD-10-CM | POA: Diagnosis not present

## 2023-03-09 DIAGNOSIS — I10 Essential (primary) hypertension: Secondary | ICD-10-CM | POA: Diagnosis not present

## 2023-03-29 DIAGNOSIS — N201 Calculus of ureter: Secondary | ICD-10-CM | POA: Diagnosis not present

## 2023-03-30 DIAGNOSIS — I10 Essential (primary) hypertension: Secondary | ICD-10-CM | POA: Diagnosis not present

## 2023-03-30 DIAGNOSIS — R7303 Prediabetes: Secondary | ICD-10-CM | POA: Diagnosis not present

## 2023-03-30 DIAGNOSIS — E7800 Pure hypercholesterolemia, unspecified: Secondary | ICD-10-CM | POA: Diagnosis not present

## 2023-03-30 DIAGNOSIS — E559 Vitamin D deficiency, unspecified: Secondary | ICD-10-CM | POA: Diagnosis not present

## 2023-04-09 DIAGNOSIS — N281 Cyst of kidney, acquired: Secondary | ICD-10-CM | POA: Diagnosis not present

## 2023-04-13 DIAGNOSIS — D4102 Neoplasm of uncertain behavior of left kidney: Secondary | ICD-10-CM | POA: Diagnosis not present

## 2023-04-13 DIAGNOSIS — N201 Calculus of ureter: Secondary | ICD-10-CM | POA: Diagnosis not present

## 2023-04-25 IMAGING — MR MR ABDOMEN WO/W CM
11 of 17 series · 29 of 48 positions shown · IV contrast (multihance)
Comparison: CT April 08, 2020

CLINICAL DATA: Surveillance and further characterization renal
lesions seen on prior CT.

EXAM:
MRI ABDOMEN WITHOUT AND WITH CONTRAST
TECHNIQUE: Multiplanar multisequence MR imaging of the abdomen was performed
both before and after the administration of intravenous contrast.
CONTRAST:  20mL MULTIHANCE GADOBENATE DIMEGLUMINE 529 MG/ML IV SOLN

[Series 3: T2 · coronal · 5.0mm · 1.72mm/px · 2 of 20 slices shown (1 of 3)]
[im 1/20]
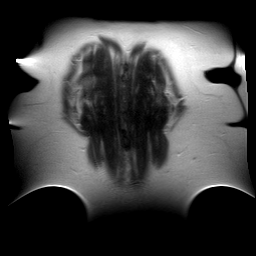
[im 20/20]
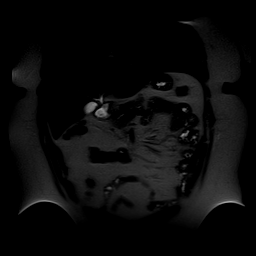

[Series 4: axial tru fisp · axial · 4.0mm · 1.76mm/px · z∈[-87,+63]mm · 3 of 30 slices shown]
[im 1/30]
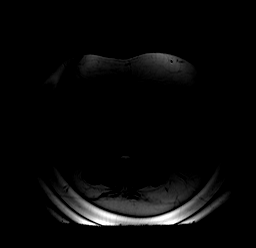
[im 15/30]
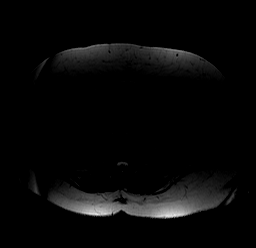
[im 30/30]
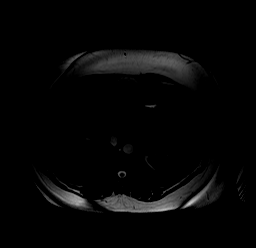

[Series 5: [id number]_500_800_p2 · axial · 6.0mm · 2.34mm/px · z∈[-95,+179]mm · 4 of 57 slices shown]
[im 1/57]
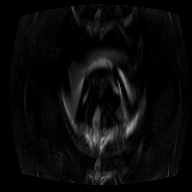
[im 19/57]
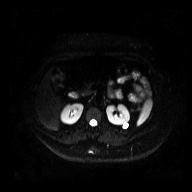
[im 38/57]
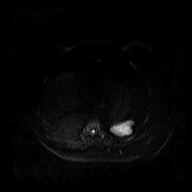
[im 57/57]
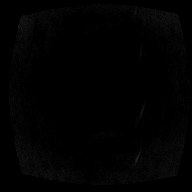

[Series 6: ep2d_[id number]_800_p2_adc · axial · 6.0mm · 2.34mm/px · z∈[-95,+179]mm · 2 of 19 slices shown]
[im 1/19]
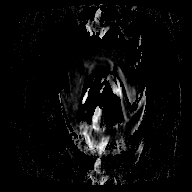
[im 19/19]
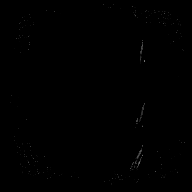

[Series 8: T2 · axial · 5.0mm · 1.76mm/px · z∈[-82,+77]mm · 2 of 30 slices shown (2 of 3)]
[im 1/30]
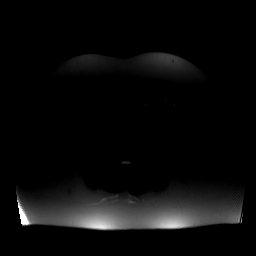
[im 30/30]
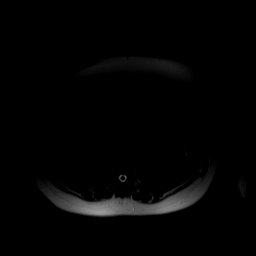

[Series 9: T2 · axial · 6.0mm · 0.88mm/px · 1 of 19 slices shown (3 of 3)]
[im 1/19]
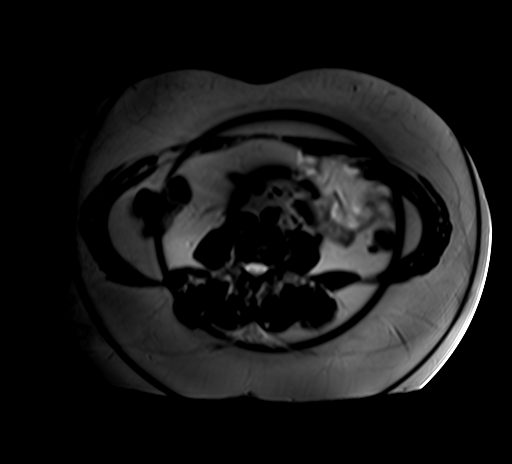

[Series 10: axial in out · axial · 5.0mm · 0.88mm/px · z∈[-88,+82]mm · 3 of 64 slices shown]
[im 1/64]
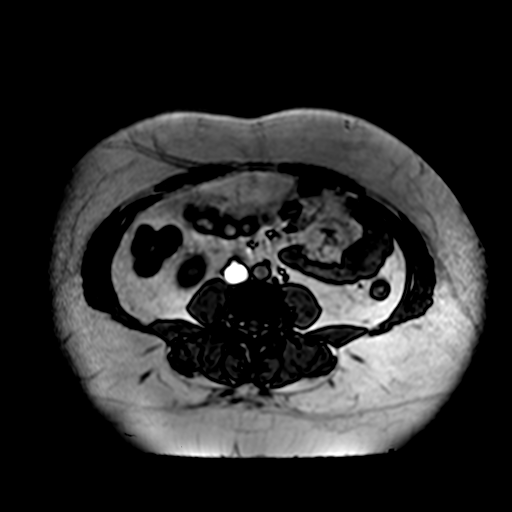
[im 32/64]
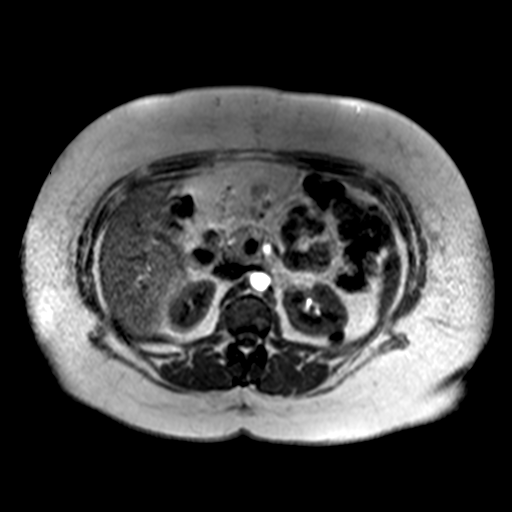
[im 64/64]
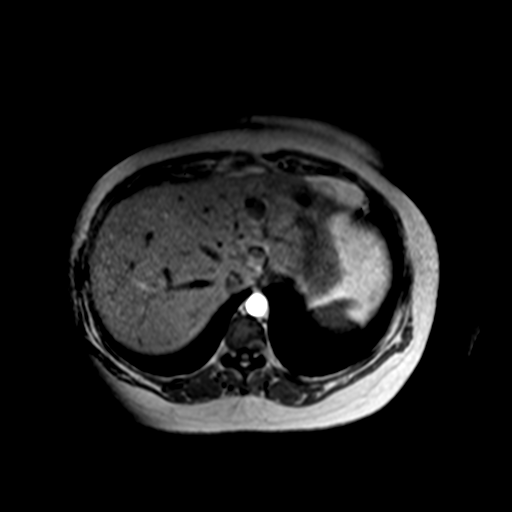

[Series 11: T1 dynamic · axial · non-contrast · 2.5mm · 0.88mm/px · z∈[-81,+76]mm · 3 of 64 slices shown]
[im 1/64]
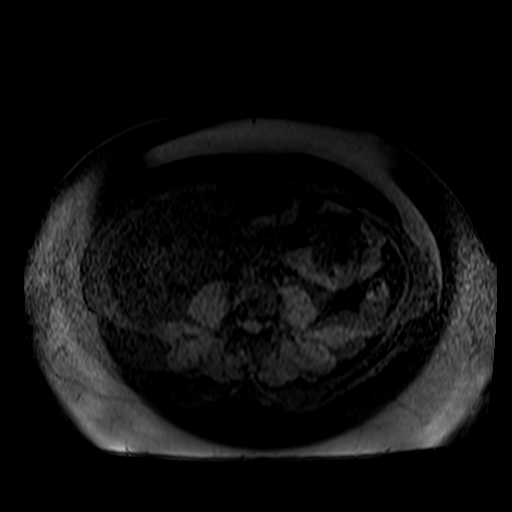
[im 32/64]
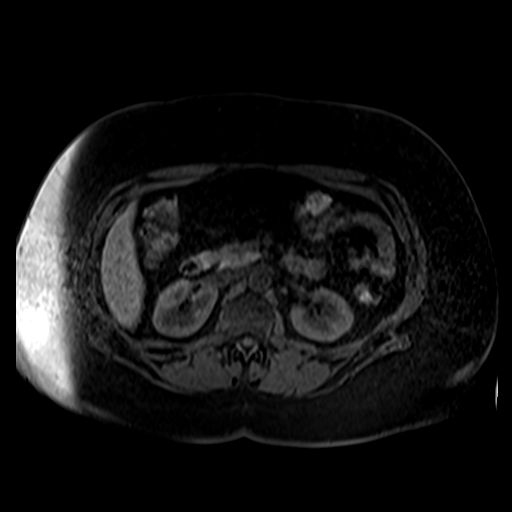
[im 64/64]
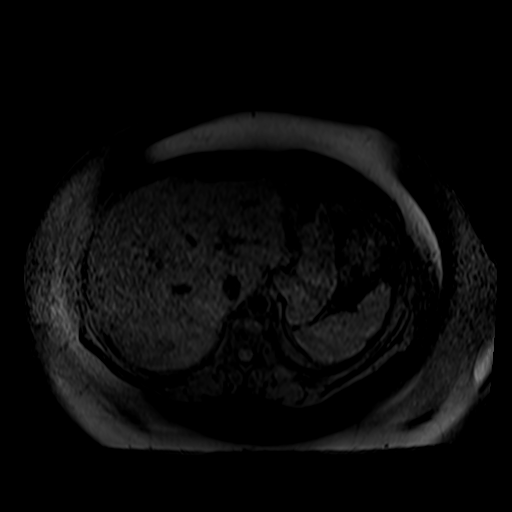

[Series 12: post 25 sec · axial · 2.5mm · 0.88mm/px · z∈[-81,+76]mm · 3 of 64 slices shown]
[im 1/64]
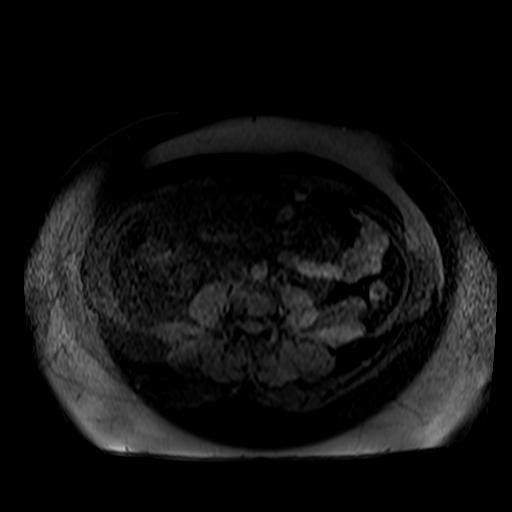
[im 32/64]
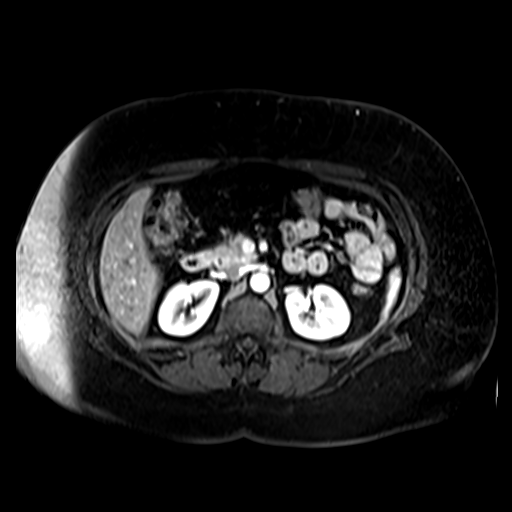
[im 64/64]
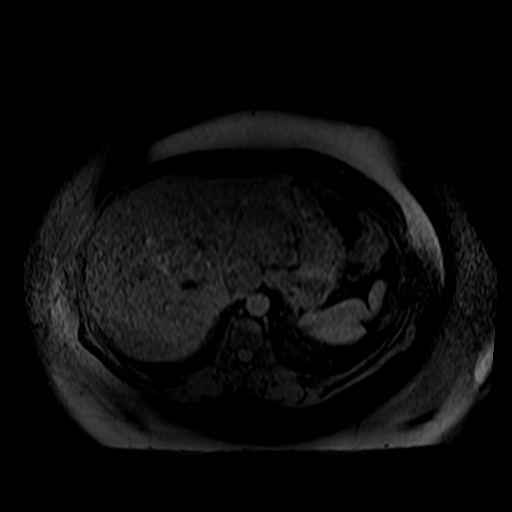

[Series 13: post 25 sec_sub · axial · 2.5mm · 0.88mm/px · z∈[-81,+76]mm · 3 of 64 slices shown]
[im 1/64]
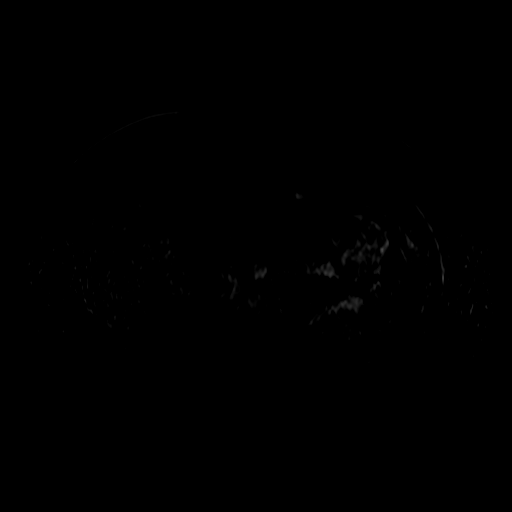
[im 32/64]
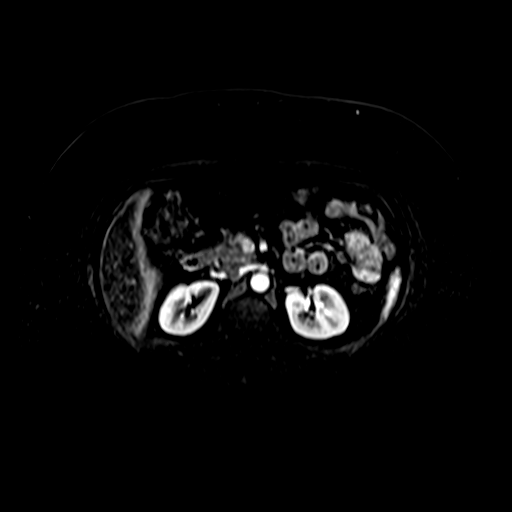
[im 64/64]
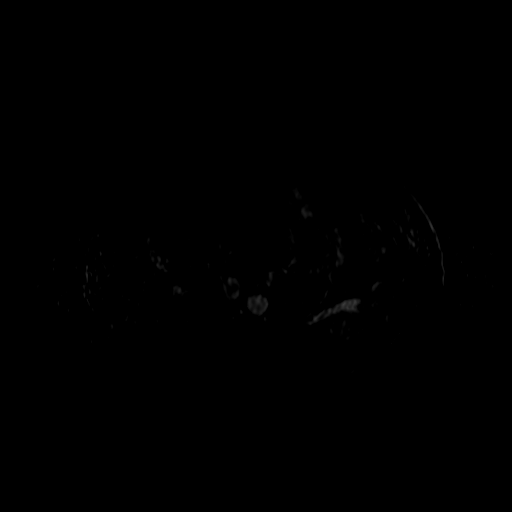

[Series 14: post 45 sec · axial · 2.5mm · 0.88mm/px · z∈[-81,+76]mm · 3 of 64 slices shown]
[im 1/64]
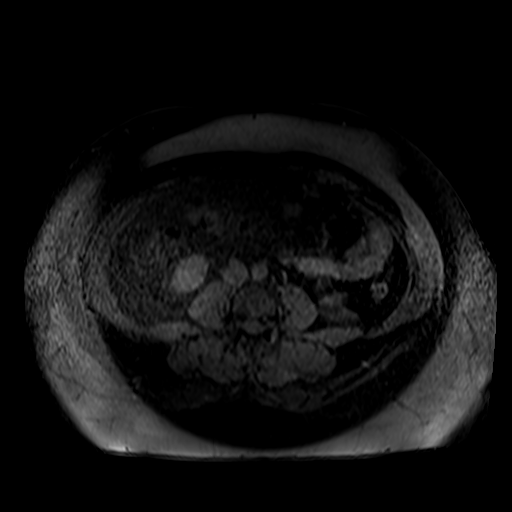
[im 32/64]
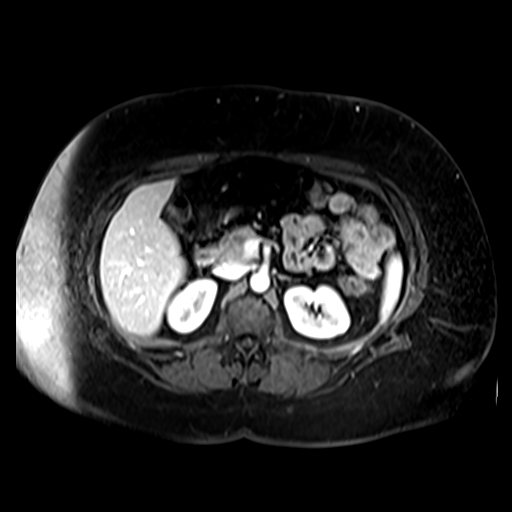
[im 64/64]
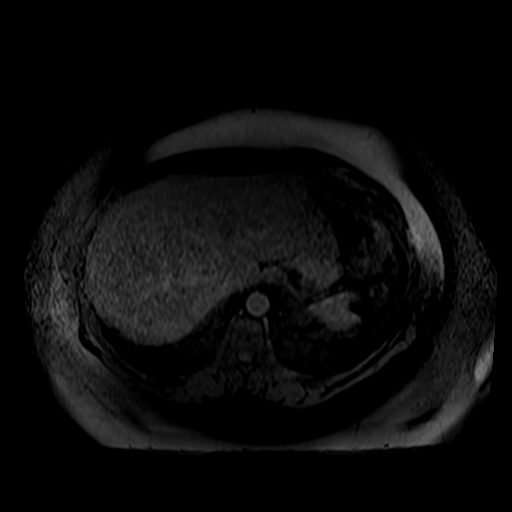

[29 of 48 positions shown; findings below may reference images not displayed]

FINDINGS: Lower chest: No acute findings.

Hepatobiliary: No mass or other parenchymal abnormality identified.
Gallbladder is unremarkable. No biliary ductal dilation.

Pancreas: Within normal limits.

Spleen:  Within normal limits.

Adrenals/Urinary Tract:  Bilateral adrenal glands are unremarkable.

No hydronephrosis.

T2 hyperintense 1.5 cm exophytic left interpolar renal lesion which
has a single thin internal septation, but does not demonstrate
suspicious postcontrast enhancement, nodularity or thickened
septations. Findings most consistent with a benign Bosniak
classification 2 renal cysts.

No solid enhancing renal lesions.

Stomach/Bowel: Visualized portions within the abdomen are
unremarkable.

Vascular/Lymphatic: No pathologically enlarged lymph nodes
identified. No abdominal aortic aneurysm demonstrated.

Other:  No abdominal ascites.

Musculoskeletal: No suspicious bone lesions identified.
IMPRESSION: Exophytic 1.5 cm left interpolar renal lesion which has a single
thin internal septation, but does not demonstrate suspicious
postcontrast enhancement, nodularity or thickened septations.
Findings consistent with a benign Bosniak classification 2 renal
cysts. No solid enhancing renal lesions.

## 2023-05-18 DIAGNOSIS — R5382 Chronic fatigue, unspecified: Secondary | ICD-10-CM | POA: Diagnosis not present

## 2023-05-18 DIAGNOSIS — E559 Vitamin D deficiency, unspecified: Secondary | ICD-10-CM | POA: Diagnosis not present

## 2023-05-18 DIAGNOSIS — I10 Essential (primary) hypertension: Secondary | ICD-10-CM | POA: Diagnosis not present

## 2023-05-18 DIAGNOSIS — F419 Anxiety disorder, unspecified: Secondary | ICD-10-CM | POA: Diagnosis not present

## 2023-05-18 DIAGNOSIS — E785 Hyperlipidemia, unspecified: Secondary | ICD-10-CM | POA: Diagnosis not present

## 2023-05-18 DIAGNOSIS — E538 Deficiency of other specified B group vitamins: Secondary | ICD-10-CM | POA: Diagnosis not present

## 2023-05-18 DIAGNOSIS — Z131 Encounter for screening for diabetes mellitus: Secondary | ICD-10-CM | POA: Diagnosis not present

## 2023-05-18 DIAGNOSIS — B379 Candidiasis, unspecified: Secondary | ICD-10-CM | POA: Diagnosis not present

## 2023-05-18 DIAGNOSIS — R6882 Decreased libido: Secondary | ICD-10-CM | POA: Diagnosis not present

## 2023-05-18 DIAGNOSIS — Z1329 Encounter for screening for other suspected endocrine disorder: Secondary | ICD-10-CM | POA: Diagnosis not present

## 2023-05-26 DIAGNOSIS — Z0000 Encounter for general adult medical examination without abnormal findings: Secondary | ICD-10-CM | POA: Diagnosis not present

## 2023-06-01 DIAGNOSIS — G47419 Narcolepsy without cataplexy: Secondary | ICD-10-CM | POA: Diagnosis not present

## 2023-06-15 DIAGNOSIS — F419 Anxiety disorder, unspecified: Secondary | ICD-10-CM | POA: Diagnosis not present

## 2023-06-15 DIAGNOSIS — E785 Hyperlipidemia, unspecified: Secondary | ICD-10-CM | POA: Diagnosis not present

## 2023-06-15 DIAGNOSIS — R5382 Chronic fatigue, unspecified: Secondary | ICD-10-CM | POA: Diagnosis not present

## 2023-06-15 DIAGNOSIS — I10 Essential (primary) hypertension: Secondary | ICD-10-CM | POA: Diagnosis not present

## 2023-07-14 DIAGNOSIS — Z6841 Body Mass Index (BMI) 40.0 and over, adult: Secondary | ICD-10-CM | POA: Diagnosis not present

## 2023-07-14 DIAGNOSIS — Z1231 Encounter for screening mammogram for malignant neoplasm of breast: Secondary | ICD-10-CM | POA: Diagnosis not present

## 2023-07-14 DIAGNOSIS — Z01419 Encounter for gynecological examination (general) (routine) without abnormal findings: Secondary | ICD-10-CM | POA: Diagnosis not present

## 2023-07-21 DIAGNOSIS — E7800 Pure hypercholesterolemia, unspecified: Secondary | ICD-10-CM | POA: Diagnosis not present

## 2023-07-21 DIAGNOSIS — E039 Hypothyroidism, unspecified: Secondary | ICD-10-CM | POA: Diagnosis not present

## 2023-07-21 DIAGNOSIS — I10 Essential (primary) hypertension: Secondary | ICD-10-CM | POA: Diagnosis not present

## 2023-07-21 DIAGNOSIS — E559 Vitamin D deficiency, unspecified: Secondary | ICD-10-CM | POA: Diagnosis not present

## 2023-07-21 DIAGNOSIS — R7303 Prediabetes: Secondary | ICD-10-CM | POA: Diagnosis not present

## 2023-08-25 DIAGNOSIS — R7303 Prediabetes: Secondary | ICD-10-CM | POA: Diagnosis not present

## 2023-08-25 DIAGNOSIS — E559 Vitamin D deficiency, unspecified: Secondary | ICD-10-CM | POA: Diagnosis not present

## 2023-08-25 DIAGNOSIS — E7800 Pure hypercholesterolemia, unspecified: Secondary | ICD-10-CM | POA: Diagnosis not present

## 2023-08-25 DIAGNOSIS — I10 Essential (primary) hypertension: Secondary | ICD-10-CM | POA: Diagnosis not present

## 2023-08-27 IMAGING — DX DG CHEST 2V
2 series · 2 of 2 positions shown · non-contrast
Comparison: 03/07/2010

CLINICAL DATA: Cough, fever

EXAM:
CHEST - 2 VIEW

[chest pa]
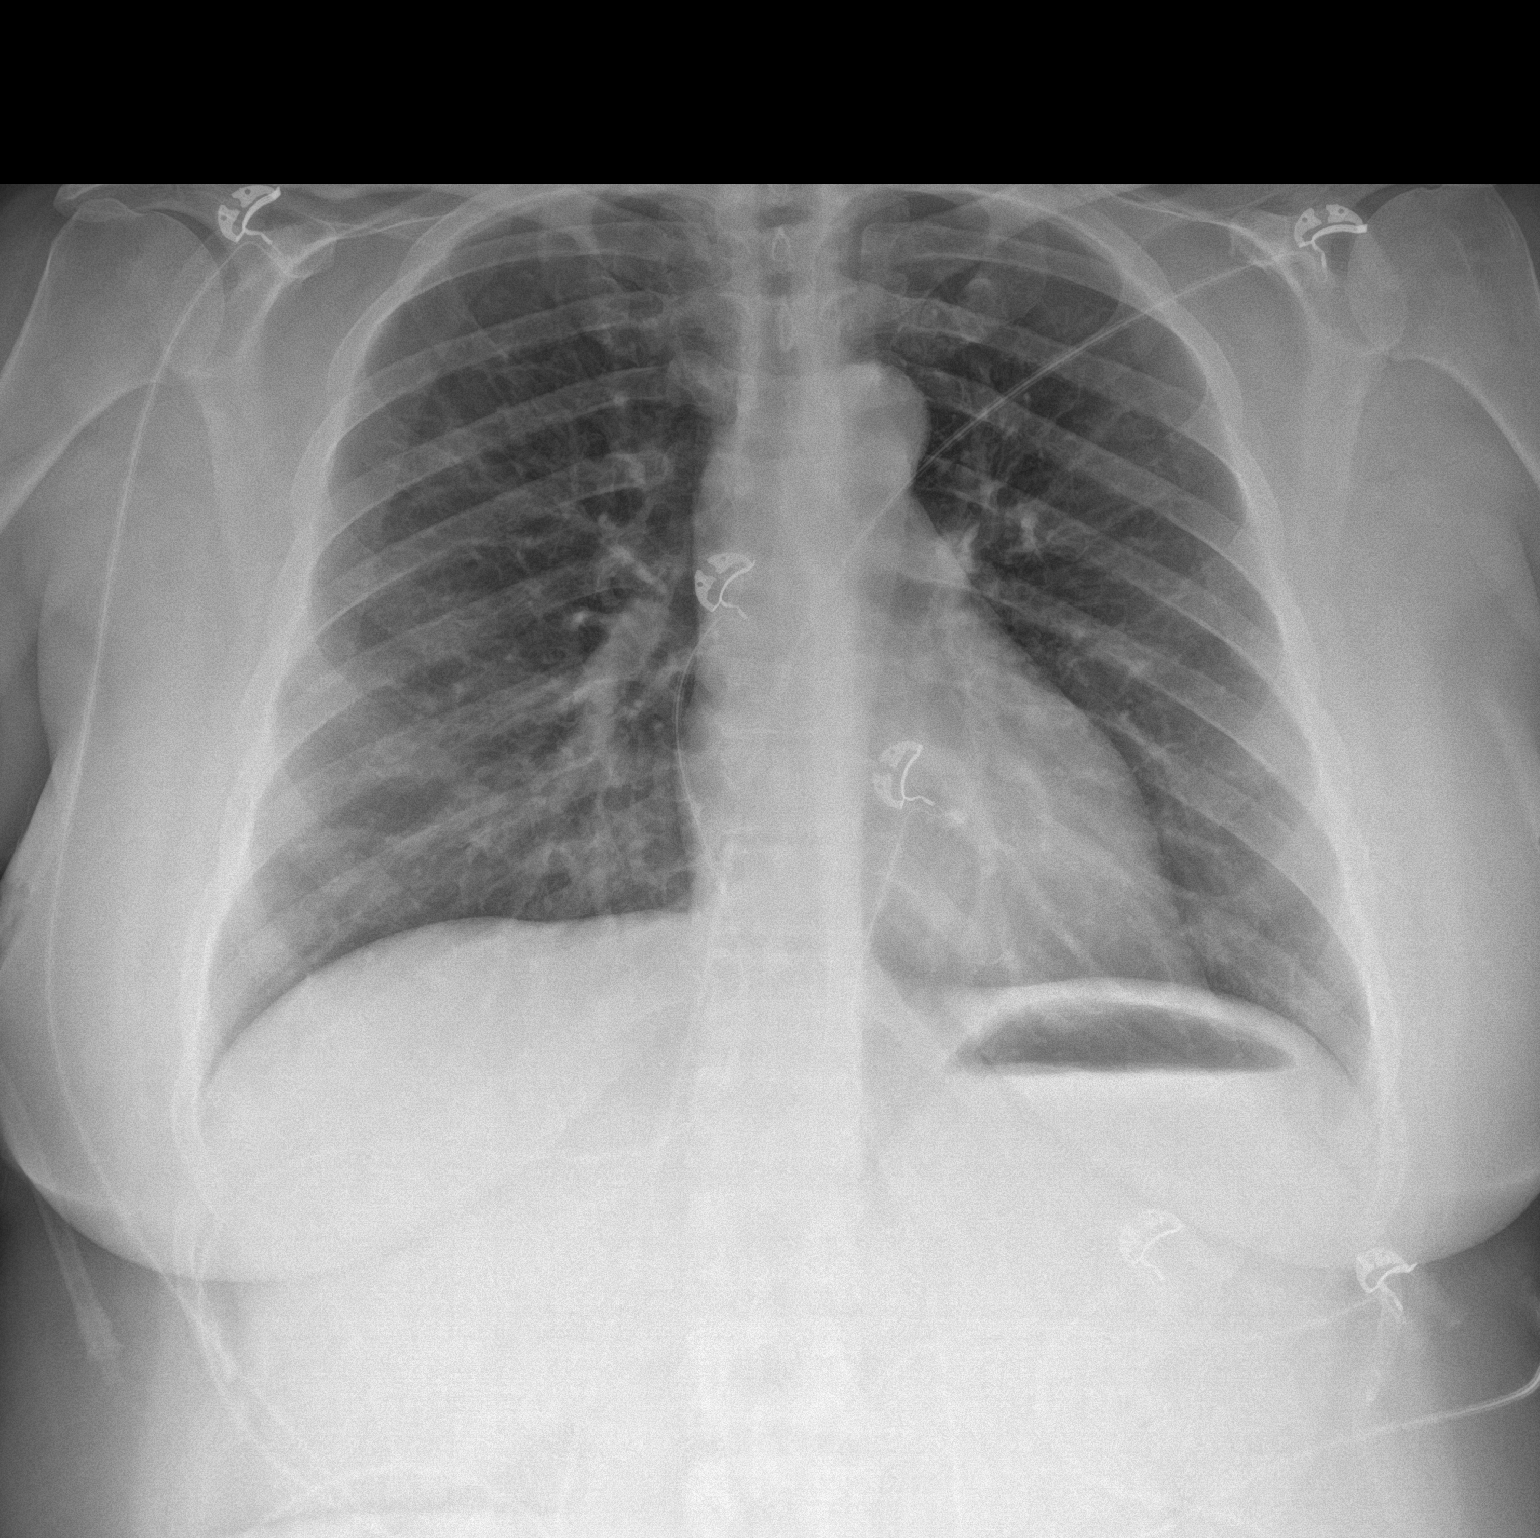

[chest lat]
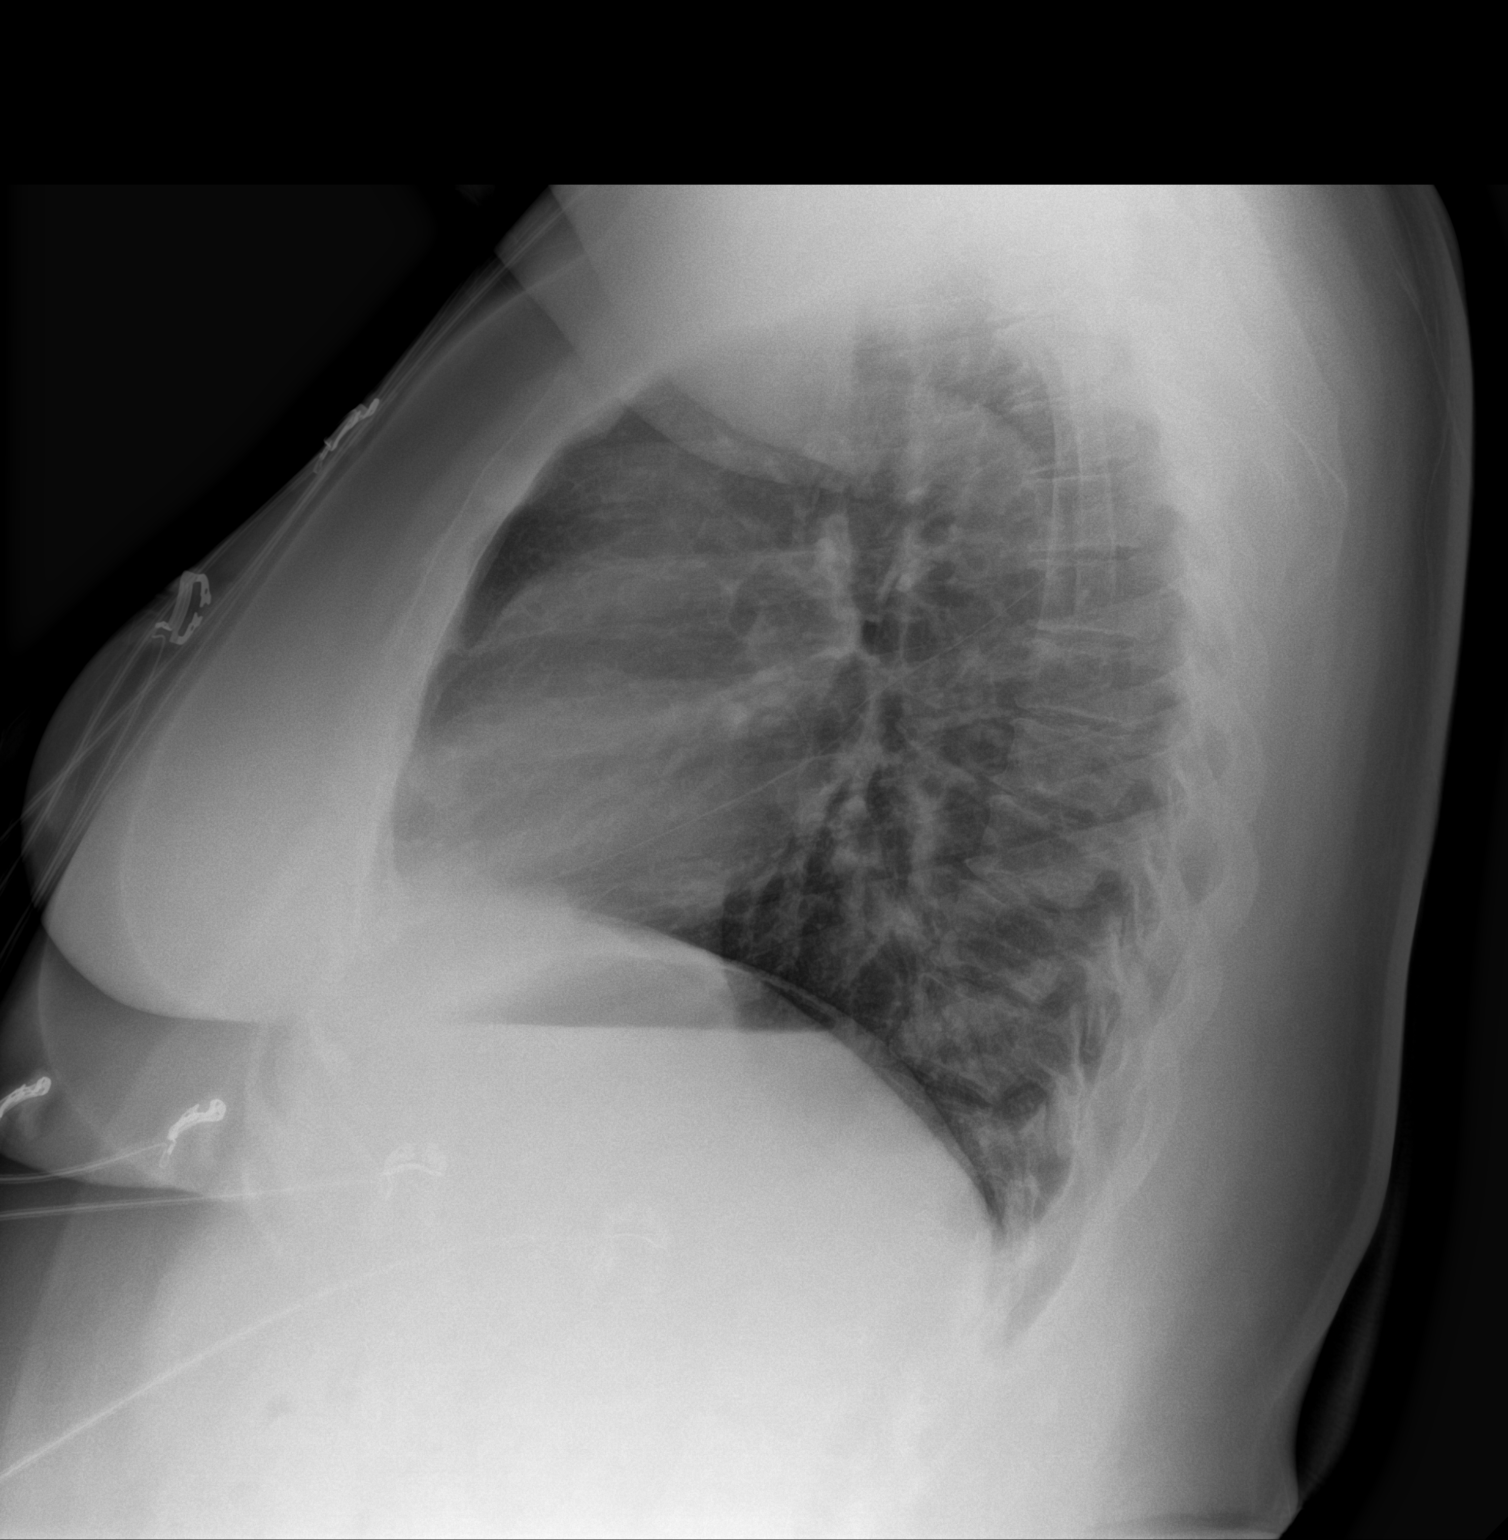

[2 of 2 positions shown; findings below may reference images not displayed]

FINDINGS: The heart size and mediastinal contours are within normal limits. No
focal airspace consolidation, pleural effusion, or pneumothorax. The
visualized skeletal structures are unremarkable.
IMPRESSION: No active cardiopulmonary disease.

## 2023-09-20 DIAGNOSIS — H608X2 Other otitis externa, left ear: Secondary | ICD-10-CM | POA: Diagnosis not present

## 2023-09-23 ENCOUNTER — Other Ambulatory Visit (HOSPITAL_BASED_OUTPATIENT_CLINIC_OR_DEPARTMENT_OTHER): Payer: Self-pay

## 2023-10-07 DIAGNOSIS — E559 Vitamin D deficiency, unspecified: Secondary | ICD-10-CM | POA: Diagnosis not present

## 2023-10-07 DIAGNOSIS — I10 Essential (primary) hypertension: Secondary | ICD-10-CM | POA: Diagnosis not present

## 2023-10-07 DIAGNOSIS — E7800 Pure hypercholesterolemia, unspecified: Secondary | ICD-10-CM | POA: Diagnosis not present

## 2023-10-07 DIAGNOSIS — R7303 Prediabetes: Secondary | ICD-10-CM | POA: Diagnosis not present

## 2023-11-22 DIAGNOSIS — J45909 Unspecified asthma, uncomplicated: Secondary | ICD-10-CM | POA: Diagnosis not present

## 2023-11-22 DIAGNOSIS — K621 Rectal polyp: Secondary | ICD-10-CM | POA: Diagnosis not present

## 2023-11-22 DIAGNOSIS — K635 Polyp of colon: Secondary | ICD-10-CM | POA: Diagnosis not present

## 2023-11-22 DIAGNOSIS — D122 Benign neoplasm of ascending colon: Secondary | ICD-10-CM | POA: Diagnosis not present

## 2023-11-22 DIAGNOSIS — Z1211 Encounter for screening for malignant neoplasm of colon: Secondary | ICD-10-CM | POA: Diagnosis not present

## 2023-12-01 DIAGNOSIS — E559 Vitamin D deficiency, unspecified: Secondary | ICD-10-CM | POA: Diagnosis not present

## 2023-12-01 DIAGNOSIS — R7303 Prediabetes: Secondary | ICD-10-CM | POA: Diagnosis not present

## 2023-12-01 DIAGNOSIS — E7800 Pure hypercholesterolemia, unspecified: Secondary | ICD-10-CM | POA: Diagnosis not present

## 2023-12-01 DIAGNOSIS — I10 Essential (primary) hypertension: Secondary | ICD-10-CM | POA: Diagnosis not present

## 2023-12-11 DIAGNOSIS — R079 Chest pain, unspecified: Secondary | ICD-10-CM | POA: Diagnosis not present

## 2023-12-13 DIAGNOSIS — G4710 Hypersomnia, unspecified: Secondary | ICD-10-CM | POA: Diagnosis not present
